# Patient Record
Sex: Female | Born: 1970 | Race: White | Hispanic: No | Marital: Married | Smoking: Never smoker
Health system: Southern US, Community
[De-identification: ages and names within clinical notes are randomized; demographics above are authoritative.]

## PROBLEM LIST (undated history)

## (undated) DIAGNOSIS — E079 Disorder of thyroid, unspecified: Secondary | ICD-10-CM

## (undated) DIAGNOSIS — M8580 Other specified disorders of bone density and structure, unspecified site: Secondary | ICD-10-CM

## (undated) DIAGNOSIS — K259 Gastric ulcer, unspecified as acute or chronic, without hemorrhage or perforation: Secondary | ICD-10-CM

## (undated) DIAGNOSIS — K828 Other specified diseases of gallbladder: Secondary | ICD-10-CM

## (undated) HISTORY — PX: PARTIAL THYMECTOMY: SHX2177

## (undated) HISTORY — DX: Gastric ulcer, unspecified as acute or chronic, without hemorrhage or perforation: K25.9

## (undated) HISTORY — DX: Disorder of thyroid, unspecified: E07.9

## (undated) HISTORY — DX: Other specified disorders of bone density and structure, unspecified site: M85.80

---

## 1994-12-29 HISTORY — PX: THYROIDECTOMY, PARTIAL: SHX18

## 2010-10-11 ENCOUNTER — Emergency Department (HOSPITAL_COMMUNITY): Admission: EM | Admit: 2010-10-11 | Discharge: 2010-10-11 | Payer: Self-pay | Admitting: Family Medicine

## 2011-07-04 ENCOUNTER — Other Ambulatory Visit: Payer: Self-pay | Admitting: Obstetrics and Gynecology

## 2011-07-04 DIAGNOSIS — R928 Other abnormal and inconclusive findings on diagnostic imaging of breast: Secondary | ICD-10-CM

## 2011-07-15 ENCOUNTER — Ambulatory Visit
Admission: RE | Admit: 2011-07-15 | Discharge: 2011-07-15 | Disposition: A | Discharge status: Home / Self Care | Payer: BC Managed Care – PPO | Source: Ambulatory Visit | Attending: Obstetrics and Gynecology | Admitting: Obstetrics and Gynecology

## 2011-07-15 DIAGNOSIS — R928 Other abnormal and inconclusive findings on diagnostic imaging of breast: Secondary | ICD-10-CM

## 2012-03-24 ENCOUNTER — Encounter (INDEPENDENT_AMBULATORY_CARE_PROVIDER_SITE_OTHER): Payer: Self-pay | Admitting: Surgery

## 2012-04-09 ENCOUNTER — Ambulatory Visit (INDEPENDENT_AMBULATORY_CARE_PROVIDER_SITE_OTHER): Payer: BC Managed Care – PPO | Admitting: Surgery

## 2012-04-29 ENCOUNTER — Ambulatory Visit (INDEPENDENT_AMBULATORY_CARE_PROVIDER_SITE_OTHER): Payer: BC Managed Care – PPO | Admitting: Surgery

## 2012-12-29 HISTORY — PX: WISDOM TOOTH EXTRACTION: SHX21

## 2015-01-02 ENCOUNTER — Other Ambulatory Visit: Payer: Self-pay | Admitting: Occupational Medicine

## 2015-01-02 ENCOUNTER — Ambulatory Visit: Payer: Self-pay

## 2015-01-02 DIAGNOSIS — R52 Pain, unspecified: Secondary | ICD-10-CM

## 2015-09-01 ENCOUNTER — Emergency Department (HOSPITAL_COMMUNITY)
Admission: EM | Admit: 2015-09-01 | Discharge: 2015-09-01 | Disposition: A | Discharge status: Home / Self Care | Payer: Commercial Managed Care - HMO | Source: Home / Self Care | Attending: Family Medicine | Admitting: Family Medicine

## 2015-09-01 ENCOUNTER — Encounter (HOSPITAL_COMMUNITY): Payer: Self-pay | Admitting: Family Medicine

## 2015-09-01 DIAGNOSIS — M5442 Lumbago with sciatica, left side: Secondary | ICD-10-CM

## 2015-09-01 MED ORDER — DICLOFENAC SODIUM 75 MG PO TBEC: 75.0000 mg | DELAYED_RELEASE_TABLET | Freq: Two times a day (BID) | ORAL | Status: DC

## 2015-09-01 MED ORDER — METHYLPREDNISOLONE 4 MG PO TBPK: ORAL_TABLET | ORAL | Status: DC

## 2015-09-01 MED ORDER — METHOCARBAMOL 500 MG PO TABS: 500.0000 mg | ORAL_TABLET | Freq: Four times a day (QID) | ORAL | Status: DC | PRN

## 2015-09-01 NOTE — Discharge Instructions (Signed)
You likely have strained part of your lower back muscles causing malalignment of the vertebrae and sciatica. Please start the Medrol Dosepak as prescribed morning. Please also start the sciatica exercises as outlined. In the evening consider taking the muscle relaxer Robaxin, followed by a hot shower and then exercises before going to bed. The Robaxin may make you sleepy but you may also be able to take it during the daytime as tolerated. On Tuesday please call your primary care physician, sports medicine clinic, orthopedic surgeon's office for follow-up in case you do not improve.  Sciatica with Rehab The sciatic nerve runs from the back down the leg and is responsible for sensation and control of the muscles in the back (posterior) side of the thigh, lower leg, and foot. Sciatica is a condition that is characterized by inflammation of this nerve.  SYMPTOMS   Signs of nerve damage, including numbness and/or weakness along the posterior side of the lower extremity.  Pain in the back of the thigh that may also travel down the leg.  Pain that worsens when sitting for long periods of time.  Occasionally, pain in the back or buttock. CAUSES  Inflammation of the sciatic nerve is the cause of sciatica. The inflammation is due to something irritating the nerve. Common sources of irritation include:  Sitting for long periods of time.  Direct trauma to the nerve.  Arthritis of the spine.  Herniated or ruptured disk.  Slipping of the vertebrae (spondylolisthesis).  Pressure from soft tissues, such as muscles or ligament-like tissue (fascia). RISK INCREASES WITH:  Sports that place pressure or stress on the spine (football or weightlifting).  Poor strength and flexibility.  Failure to warm up properly before activity.  Family history of low back pain or disk disorders.  Previous back injury or surgery.  Poor body mechanics, especially when lifting, or poor posture. PREVENTION   Warm  up and stretch properly before activity.  Maintain physical fitness:  Strength, flexibility, and endurance.  Cardiovascular fitness.  Learn and use proper technique, especially with posture and lifting. When possible, have coach correct improper technique.  Avoid activities that place stress on the spine. PROGNOSIS If treated properly, then sciatica usually resolves within 6 weeks. However, occasionally surgery is necessary.  RELATED COMPLICATIONS   Permanent nerve damage, including pain, numbness, tingle, or weakness.  Chronic back pain.  Risks of surgery: infection, bleeding, nerve damage, or damage to surrounding tissues. TREATMENT Treatment initially involves resting from any activities that aggravate your symptoms. The use of ice and medication may help reduce pain and inflammation. The use of strengthening and stretching exercises may help reduce pain with activity. These exercises may be performed at home or with referral to a therapist. A therapist may recommend further treatments, such as transcutaneous electronic nerve stimulation (TENS) or ultrasound. Your caregiver may recommend corticosteroid injections to help reduce inflammation of the sciatic nerve. If symptoms persist despite non-surgical (conservative) treatment, then surgery may be recommended. MEDICATION  If pain medication is necessary, then nonsteroidal anti-inflammatory medications, such as aspirin and ibuprofen, or other minor pain relievers, such as acetaminophen, are often recommended.  Do not take pain medication for 7 days before surgery.  Prescription pain relievers may be given if deemed necessary by your caregiver. Use only as directed and only as much as you need.  Ointments applied to the skin may be helpful.  Corticosteroid injections may be given by your caregiver. These injections should be reserved for the most serious cases, because they  may only be given a certain number of times. HEAT AND  COLD  Cold treatment (icing) relieves pain and reduces inflammation. Cold treatment should be applied for 10 to 15 minutes every 2 to 3 hours for inflammation and pain and immediately after any activity that aggravates your symptoms. Use ice packs or massage the area with a piece of ice (ice massage).  Heat treatment may be used prior to performing the stretching and strengthening activities prescribed by your caregiver, physical therapist, or athletic trainer. Use a heat pack or soak the injury in warm water. SEEK MEDICAL CARE IF:  Treatment seems to offer no benefit, or the condition worsens.  Any medications produce adverse side effects. EXERCISES  RANGE OF MOTION (ROM) AND STRETCHING EXERCISES - Sciatica Most people with sciatic will find that their symptoms worsen with either excessive bending forward (flexion) or arching at the low back (extension). The exercises which will help resolve your symptoms will focus on the opposite motion. Your physician, physical therapist or athletic trainer will help you determine which exercises will be most helpful to resolve your low back pain. Do not complete any exercises without first consulting with your clinician. Discontinue any exercises which worsen your symptoms until you speak to your clinician. If you have pain, numbness or tingling which travels down into your buttocks, leg or foot, the goal of the therapy is for these symptoms to move closer to your back and eventually resolve. Occasionally, these leg symptoms will get better, but your low back pain may worsen; this is typically an indication of progress in your rehabilitation. Be certain to be very alert to any changes in your symptoms and the activities in which you participated in the 24 hours prior to the change. Sharing this information with your clinician will allow him/her to most efficiently treat your condition. These exercises may help you when beginning to rehabilitate your injury. Your  symptoms may resolve with or without further involvement from your physician, physical therapist or athletic trainer. While completing these exercises, remember:   Restoring tissue flexibility helps normal motion to return to the joints. This allows healthier, less painful movement and activity.  An effective stretch should be held for at least 30 seconds.  A stretch should never be painful. You should only feel a gentle lengthening or release in the stretched tissue. FLEXION RANGE OF MOTION AND STRETCHING EXERCISES: STRETCH - Flexion, Single Knee to Chest   Lie on a firm bed or floor with both legs extended in front of you.  Keeping one leg in contact with the floor, bring your opposite knee to your chest. Hold your leg in place by either grabbing behind your thigh or at your knee.  Pull until you feel a gentle stretch in your low back. Hold __________ seconds.  Slowly release your grasp and repeat the exercise with the opposite side. Repeat __________ times. Complete this exercise __________ times per day.  STRETCH - Flexion, Double Knee to Chest  Lie on a firm bed or floor with both legs extended in front of you.  Keeping one leg in contact with the floor, bring your opposite knee to your chest.  Tense your stomach muscles to support your back and then lift your other knee to your chest. Hold your legs in place by either grabbing behind your thighs or at your knees.  Pull both knees toward your chest until you feel a gentle stretch in your low back. Hold __________ seconds.  Tense your stomach muscles  and slowly return one leg at a time to the floor. Repeat __________ times. Complete this exercise __________ times per day.  STRETCH - Low Trunk Rotation   Lie on a firm bed or floor. Keeping your legs in front of you, bend your knees so they are both pointed toward the ceiling and your feet are flat on the floor.  Extend your arms out to the side. This will stabilize your upper  body by keeping your shoulders in contact with the floor.  Gently and slowly drop both knees together to one side until you feel a gentle stretch in your low back. Hold for __________ seconds.  Tense your stomach muscles to support your low back as you bring your knees back to the starting position. Repeat the exercise to the other side. Repeat __________ times. Complete this exercise __________ times per day  EXTENSION RANGE OF MOTION AND FLEXIBILITY EXERCISES: STRETCH - Extension, Prone on Elbows  Lie on your stomach on the floor, a bed will be too soft. Place your palms about shoulder width apart and at the height of your head.  Place your elbows under your shoulders. If this is too painful, stack pillows under your chest.  Allow your body to relax so that your hips drop lower and make contact more completely with the floor.  Hold this position for __________ seconds.  Slowly return to lying flat on the floor. Repeat __________ times. Complete this exercise __________ times per day.  RANGE OF MOTION - Extension, Prone Press Ups  Lie on your stomach on the floor, a bed will be too soft. Place your palms about shoulder width apart and at the height of your head.  Keeping your back as relaxed as possible, slowly straighten your elbows while keeping your hips on the floor. You may adjust the placement of your hands to maximize your comfort. As you gain motion, your hands will come more underneath your shoulders.  Hold this position __________ seconds.  Slowly return to lying flat on the floor. Repeat __________ times. Complete this exercise __________ times per day.  STRENGTHENING EXERCISES - Sciatica  These exercises may help you when beginning to rehabilitate your injury. These exercises should be done near your "sweet spot." This is the neutral, low-back arch, somewhere between fully rounded and fully arched, that is your least painful position. When performed in this safe range of  motion, these exercises can be used for people who have either a flexion or extension based injury. These exercises may resolve your symptoms with or without further involvement from your physician, physical therapist or athletic trainer. While completing these exercises, remember:   Muscles can gain both the endurance and the strength needed for everyday activities through controlled exercises.  Complete these exercises as instructed by your physician, physical therapist or athletic trainer. Progress with the resistance and repetition exercises only as your caregiver advises.  You may experience muscle soreness or fatigue, but the pain or discomfort you are trying to eliminate should never worsen during these exercises. If this pain does worsen, stop and make certain you are following the directions exactly. If the pain is still present after adjustments, discontinue the exercise until you can discuss the trouble with your clinician. STRENGTHENING - Deep Abdominals, Pelvic Tilt   Lie on a firm bed or floor. Keeping your legs in front of you, bend your knees so they are both pointed toward the ceiling and your feet are flat on the floor.  Tense your lower  abdominal muscles to press your low back into the floor. This motion will rotate your pelvis so that your tail bone is scooping upwards rather than pointing at your feet or into the floor.  With a gentle tension and even breathing, hold this position for __________ seconds. Repeat __________ times. Complete this exercise __________ times per day.  STRENGTHENING - Abdominals, Crunches   Lie on a firm bed or floor. Keeping your legs in front of you, bend your knees so they are both pointed toward the ceiling and your feet are flat on the floor. Cross your arms over your chest.  Slightly tip your chin down without bending your neck.  Tense your abdominals and slowly lift your trunk high enough to just clear your shoulder blades. Lifting higher can  put excessive stress on the low back and does not further strengthen your abdominal muscles.  Control your return to the starting position. Repeat __________ times. Complete this exercise __________ times per day.  STRENGTHENING - Quadruped, Opposite UE/LE Lift  Assume a hands and knees position on a firm surface. Keep your hands under your shoulders and your knees under your hips. You may place padding under your knees for comfort.  Find your neutral spine and gently tense your abdominal muscles so that you can maintain this position. Your shoulders and hips should form a rectangle that is parallel with the floor and is not twisted.  Keeping your trunk steady, lift your right hand no higher than your shoulder and then your left leg no higher than your hip. Make sure you are not holding your breath. Hold this position __________ seconds.  Continuing to keep your abdominal muscles tense and your back steady, slowly return to your starting position. Repeat with the opposite arm and leg. Repeat __________ times. Complete this exercise __________ times per day.  STRENGTHENING - Abdominals and Quadriceps, Straight Leg Raise   Lie on a firm bed or floor with both legs extended in front of you.  Keeping one leg in contact with the floor, bend the other knee so that your foot can rest flat on the floor.  Find your neutral spine, and tense your abdominal muscles to maintain your spinal position throughout the exercise.  Slowly lift your straight leg off the floor about 6 inches for a count of 15, making sure to not hold your breath.  Still keeping your neutral spine, slowly lower your leg all the way to the floor. Repeat this exercise with each leg __________ times. Complete this exercise __________ times per day. POSTURE AND BODY MECHANICS CONSIDERATIONS - Sciatica Keeping correct posture when sitting, standing or completing your activities will reduce the stress put on different body tissues,  allowing injured tissues a chance to heal and limiting painful experiences. The following are general guidelines for improved posture. Your physician or physical therapist will provide you with any instructions specific to your needs. While reading these guidelines, remember:  The exercises prescribed by your provider will help you have the flexibility and strength to maintain correct postures.  The correct posture provides the optimal environment for your joints to work. All of your joints have less wear and tear when properly supported by a spine with good posture. This means you will experience a healthier, less painful body.  Correct posture must be practiced with all of your activities, especially prolonged sitting and standing. Correct posture is as important when doing repetitive low-stress activities (typing) as it is when doing a single heavy-load activity (lifting).  RESTING POSITIONS Consider which positions are most painful for you when choosing a resting position. If you have pain with flexion-based activities (sitting, bending, stooping, squatting), choose a position that allows you to rest in a less flexed posture. You would want to avoid curling into a fetal position on your side. If your pain worsens with extension-based activities (prolonged standing, working overhead), avoid resting in an extended position such as sleeping on your stomach. Most people will find more comfort when they rest with their spine in a more neutral position, neither too rounded nor too arched. Lying on a non-sagging bed on your side with a pillow between your knees, or on your back with a pillow under your knees will often provide some relief. Keep in mind, being in any one position for a prolonged period of time, no matter how correct your posture, can still lead to stiffness. PROPER SITTING POSTURE In order to minimize stress and discomfort on your spine, you must sit with correct posture Sitting with good  posture should be effortless for a healthy body. Returning to good posture is a gradual process. Many people can work toward this most comfortably by using various supports until they have the flexibility and strength to maintain this posture on their own. When sitting with proper posture, your ears will fall over your shoulders and your shoulders will fall over your hips. You should use the back of the chair to support your upper back. Your low back will be in a neutral position, just slightly arched. You may place a small pillow or folded towel at the base of your low back for support.  When working at a desk, create an environment that supports good, upright posture. Without extra support, muscles fatigue and lead to excessive strain on joints and other tissues. Keep these recommendations in mind: CHAIR:   A chair should be able to slide under your desk when your back makes contact with the back of the chair. This allows you to work closely.  The chair's height should allow your eyes to be level with the upper part of your monitor and your hands to be slightly lower than your elbows. BODY POSITION  Your feet should make contact with the floor. If this is not possible, use a foot rest.  Keep your ears over your shoulders. This will reduce stress on your neck and low back. INCORRECT SITTING POSTURES   If you are feeling tired and unable to assume a healthy sitting posture, do not slouch or slump. This puts excessive strain on your back tissues, causing more damage and pain. Healthier options include:  Using more support, like a lumbar pillow.  Switching tasks to something that requires you to be upright or walking.  Talking a brief walk.  Lying down to rest in a neutral-spine position. PROLONGED STANDING WHILE SLIGHTLY LEANING FORWARD  When completing a task that requires you to lean forward while standing in one place for a long time, place either foot up on a stationary 2-4 inch high  object to help maintain the best posture. When both feet are on the ground, the low back tends to lose its slight inward curve. If this curve flattens (or becomes too large), then the back and your other joints will experience too much stress, fatigue more quickly and can cause pain.  CORRECT STANDING POSTURES Proper standing posture should be assumed with all daily activities, even if they only take a few moments, like when brushing your teeth. As in  sitting, your ears should fall over your shoulders and your shoulders should fall over your hips. You should keep a slight tension in your abdominal muscles to brace your spine. Your tailbone should point down to the ground, not behind your body, resulting in an over-extended swayback posture.  INCORRECT STANDING POSTURES  Common incorrect standing postures include a forward head, locked knees and/or an excessive swayback. WALKING Walk with an upright posture. Your ears, shoulders and hips should all line-up. PROLONGED ACTIVITY IN A FLEXED POSITION When completing a task that requires you to bend forward at your waist or lean over a low surface, try to find a way to stabilize 3 of 4 of your limbs. You can place a hand or elbow on your thigh or rest a knee on the surface you are reaching across. This will provide you more stability so that your muscles do not fatigue as quickly. By keeping your knees relaxed, or slightly bent, you will also reduce stress across your low back. CORRECT LIFTING TECHNIQUES DO :   Assume a wide stance. This will provide you more stability and the opportunity to get as close as possible to the object which you are lifting.  Tense your abdominals to brace your spine; then bend at the knees and hips. Keeping your back locked in a neutral-spine position, lift using your leg muscles. Lift with your legs, keeping your back straight.  Test the weight of unknown objects before attempting to lift them.  Try to keep your elbows  locked down at your sides in order get the best strength from your shoulders when carrying an object.  Always ask for help when lifting heavy or awkward objects. INCORRECT LIFTING TECHNIQUES DO NOT:   Lock your knees when lifting, even if it is a small object.  Bend and twist. Pivot at your feet or move your feet when needing to change directions.  Assume that you cannot safely pick up a paperclip without proper posture. Document Released: 12/15/2005 Document Revised: 05/01/2014 Document Reviewed: 03/29/2009 [facility] Patient Information 2015 [place], [place]. This information is not intended to replace advice given to you by your health care provider. Make sure you discuss any questions you have with your health care provider.

## 2015-09-01 NOTE — ED Provider Notes (Signed)
CSN: [id number]     Arrival date & time 09/01/15  1705 History   First MD Initiated Contact with Patient 09/01/15 1729     Chief Complaint  Patient presents with  . Back Pain   (Consider location/radiation/quality/duration/timing/severity/associated sxs/prior Treatment) HPI  L lower back. Radiation down L leg. Recent exertion w/ lifting items at work. Current episode started this morning. Previous episode was one week ago. Episodes are coming on w/ greater frequency. Aleve w/o benefit this am. Pain is constant since this am. Not better or worse since onset.   Denies joint effusions, trauma, fevers, rash, CP, SOB, saddle anesthesia or loss of bowel or bladder function or weakness.  Has had this in the past for years   Past Medical History  Diagnosis Date  . Thyroid disease   . Migraine    Past Surgical History  Procedure Laterality Date  . Partial thymectomy     Family History  Problem Relation Age of Onset  . Thyroid disease Mother   . Cancer Mother   . Cancer Other    Social History  Substance Use Topics  . Smoking status: Never Smoker   . Smokeless tobacco: None  . Alcohol Use: Yes   OB History    No data available     Review of Systems Per HPI with all other pertinent systems negative.   Allergies  Review of patient's allergies indicates no known allergies.  Home Medications   Prior to Admission medications   Medication Sig Start Date End Date Taking? Authorizing Provider  beta carotene w/minerals (OCUVITE) tablet Take 1 tablet by mouth daily.    Historical Provider, MD  methocarbamol (ROBAXIN) 500 MG tablet Take 1-2 tablets (500-1,000 mg total) by mouth every 6 (six) hours as needed for muscle spasms. 09/01/15   Waldemar Dickens, MD  methylPREDNISolone (MEDROL DOSEPAK) 4 MG TBPK tablet Take as prescribed 09/01/15   Waldemar Dickens, MD   Meds Ordered and Administered this Visit  Medications - No data to display  BP 99/68 mmHg  Pulse 61  Temp(Src) 97.9 F  (36.6 C) (Oral)  Resp 18  SpO2 99% No data found.   Physical Exam Physical Exam  Constitutional: oriented to person, place, and time. appears well-developed and well-nourished. No distress.  HENT:  Head: Normocephalic and atraumatic.  Eyes: EOMI. PERRL.  Neck: Normal range of motion.  Abdominal: Soft. Bowel sounds are normal. NonTTP, no distension.  Musculoskeletal: Left lower paraspinal muscle tightness and tenderness to palpation..  Neurological: alert and oriented to person, place, and time.  Skin: Skin is warm. No rash noted. non diaphoretic.  Psychiatric: normal mood and affect. behavior is normal. Judgment and thought content normal.   ED Course  Procedures (including critical care time)  Labs Review Labs Reviewed - No data to display  Imaging Review No results found.   Visual Acuity Review  Right Eye Distance:   Left Eye Distance:   Bilateral Distance:    Right Eye Near:   Left Eye Near:    Bilateral Near:         MDM   1. Low back pain with radiation, left    Start Medrol Dosepak and Robaxin and exercises. Start Voltaren after stopping Medrol dose pack. Patient may need more formal follow-up in sports medicine or primary care physician's office for PT or other workup.    Waldemar Dickens, MD 09/01/15 [phone]

## 2015-09-01 NOTE — ED Notes (Signed)
Pt here with low back pain radiating down left leg

## 2015-09-10 ENCOUNTER — Ambulatory Visit
Admission: RE | Admit: 2015-09-10 | Discharge: 2015-09-10 | Disposition: A | Discharge status: Home / Self Care | Payer: Commercial Managed Care - HMO | Source: Ambulatory Visit | Attending: Sports Medicine | Admitting: Sports Medicine

## 2015-09-10 ENCOUNTER — Encounter: Payer: Self-pay | Admitting: Sports Medicine

## 2015-09-10 ENCOUNTER — Ambulatory Visit (INDEPENDENT_AMBULATORY_CARE_PROVIDER_SITE_OTHER): Payer: Commercial Managed Care - HMO | Admitting: Sports Medicine

## 2015-09-10 VITALS — BP 117/72 | Ht 61.0 in | Wt 140.0 lb

## 2015-09-10 DIAGNOSIS — M545 Low back pain, unspecified: Secondary | ICD-10-CM | POA: Insufficient documentation

## 2015-09-10 NOTE — Assessment & Plan Note (Signed)
Patient's symptoms most consistent with left sided SI joint inflammation. DDx includes possible disc herniation w/ nerve root compression at the L5 level, as we cannot yet rule this out w/ some occasional radiation of pain to the left foot and some slight weakness appreciated of the extensor hallicus longus.  - 2 view lumbar XR ordered - Formal physical therapy referral made. - patient is to continue taking diclofenac as prescribed - Ice/heat as needed  Next:  - Consider IM steroid/toradol injection.  - Consider MRI if symptoms persist/worsen.

## 2015-09-10 NOTE — Progress Notes (Addendum)
   HPI  CC: Left low back pain Gutierrez here for left sided low back pain. Pain first presented ~3wks ago. She states that prior to this onset she had been lifting and moving books at work (she works as a Licensed conveyancer). She denies any specific moment of injury or awkward movement. The following day was the first day of pain. Pain has steadily worsened since that time. Pain is located to the left lumbar perispinals, left SI, and left glut. Pain is sharp and sometimes electric in nature. She reports a couple of instances where the pain traveled beyond the knee in into the posterior foot/ankle. She denies weakness but reports some occasional numbness in her left thigh. Denies saddle anesthesia, bladder/bowel incontinence, weakness, falls.   Review of Systems   See HPI for ROS. All other systems reviewed and are negative.  Objective: BP 117/72 mmHg  Ht 5\' 1"  (1.549 m)  Wt 140 lb (63.504 kg)  BMI 26.47 kg/m2 Gen: NAD, alert, cooperative, and pleasant. Lower extremities: FROM, some discomfort in Lt leg w/ full flexion. Strength >> IR: 5/5, ER: 5/5, Flexion: 5/5, Extension: 5/5, Abduction: 5/5, Adduction: 5/5, Plantar flexion 5/5, Dorsiflexion 5/5, Extensor Hallicus Longus +1/6 (Left) 5/5 (right). Pelvic alignment unremarkable to inspection and palpation. Standing hip rotation and gait without trendelenburg. Greater trochanter without tenderness to palpation. Left SI joint tenderness, and positive FABER test. Negative straight leg raise for reproducible pain beyond knee. Neuro: Alert and oriented, Speech clear, No gross deficits. Sensation intact and symmetric bilaterally. DTRs +3 patellar (bilat), +2 Achilles (bilat).  Assessment and plan:  Low back pain Gutierrez's symptoms most consistent with left sided SI joint inflammation. DDx includes possible disc herniation w/ nerve root compression at the L5 level, as we cannot yet rule this out w/ some occasional radiation of pain to the left foot and some slight  weakness appreciated of the extensor hallicus longus.  - 2 view lumbar XR ordered - Formal physical therapy referral made. - Gutierrez is to continue taking diclofenac as prescribed - Ice/heat as needed  Next:  - Consider IM steroid/toradol injection.  - Consider MRI if symptoms persist/worsen.   [name], MD,MS,  PGY2 09/10/2015 11:56 AM   Gutierrez seen and evaluated with the above-named resident. I agree with the plan of care. Gutierrez's symptoms are likely originating from her left SI joint but she may also be getting an element of lumbar radiculopathy. We discussed the possibility of I am cortisone and Toradol injections but we will wait on that for now. She will start physical therapy with [name] and will follow-up with me in 3 weeks. She may continue with her Voltaren as needed. I've asked that she avoid any sort of activity that causes increasing pain. Call with questions or concerns prior to her follow-up visit.  Addendum: Lumbar x-rays are reviewed. Very slight scoliotic curve but otherwise unremarkable. No significant disc space narrowing. SI joints are unremarkable.

## 2015-10-02 ENCOUNTER — Encounter: Payer: Self-pay | Admitting: Sports Medicine

## 2015-10-02 ENCOUNTER — Ambulatory Visit (INDEPENDENT_AMBULATORY_CARE_PROVIDER_SITE_OTHER): Payer: Commercial Managed Care - HMO | Admitting: Sports Medicine

## 2015-10-02 VITALS — BP 106/47 | HR 74 | Ht 61.0 in | Wt 140.0 lb

## 2015-10-02 DIAGNOSIS — M545 Low back pain: Secondary | ICD-10-CM

## 2015-10-02 NOTE — Progress Notes (Signed)
   Subjective:    Patient ID: Crystal Gutierrez, female    DOB: 1971-02-09, 44 y.o.   MRN: [id number]  HPI CC: Low back pain  Patient comes in today for follow-up on low back pain and left leg radiculopathy. Overall symptoms are improving. Numbness and tingling that she was experiencing down the left leg have resolved. No weakness. Only residual symptom is some tightness in her left lower back. She has been working with Vivi Ferns in physical therapy and feels like that has been helpful. Today is her last physical therapy visit. She has weaned off of the oral anti-inflammatory's. X-rays of her lumbar spine showed a very slight scoliotic curve but otherwise was unremarkable.    Review of Systems    as above Objective:   Physical Exam Well-developed, well-nourished. No acute distress. Awake alert and oriented 3. Vital signs reviewed.  Lumbar spine: Good lumbar range of motion. No spasm. Negative straight leg raise. Strength is 5/5 both lower extremities. Sensations intact light touch grossly. Negative Faber's. Walking without a limp.       Assessment & Plan:  Improving low back pain with left leg radiculopathy likely secondary to bulging lumbar disc  Patient symptoms are improving with physical therapy. I do not think we need to pursue further diagnostic imaging at this time. However, if her symptoms return I would reconsider this. She understands the importance of continuing with a home exercise program and avoiding activities that put extra stress on her back. Patient will notify me if symptoms persist or worsen. Otherwise, follow-up as needed.

## 2016-03-01 ENCOUNTER — Emergency Department (HOSPITAL_COMMUNITY)
Admission: EM | Admit: 2016-03-01 | Discharge: 2016-03-01 | Disposition: A | Discharge status: Home / Self Care | Payer: 59 | Source: Home / Self Care | Attending: Emergency Medicine | Admitting: Emergency Medicine

## 2016-03-01 ENCOUNTER — Encounter (HOSPITAL_COMMUNITY): Payer: Self-pay | Admitting: Emergency Medicine

## 2016-03-01 DIAGNOSIS — J111 Influenza due to unidentified influenza virus with other respiratory manifestations: Secondary | ICD-10-CM | POA: Diagnosis not present

## 2016-03-01 MED ORDER — AZITHROMYCIN 250 MG PO TABS: ORAL_TABLET | ORAL | Status: DC

## 2016-03-01 NOTE — ED Notes (Signed)
The patient presented to the [facility] with a complaint of a cough and fever x 4 days.

## 2016-03-01 NOTE — Discharge Instructions (Signed)
Cough, Adult Coughing is a reflex that clears your throat and your airways. Coughing helps to heal and protect your lungs. It is normal to cough occasionally, but a cough that happens with other symptoms or lasts a long time may be a sign of a condition that needs treatment. A cough may last only 2-3 weeks (acute), or it may last longer than 8 weeks (chronic). CAUSES Coughing is commonly caused by:  Breathing in substances that irritate your lungs.  A viral or bacterial respiratory infection.  Allergies.  Asthma.  Postnasal drip.  Smoking.  Acid backing up from the stomach into the esophagus (gastroesophageal reflux).  Certain medicines.  Chronic lung problems, including COPD (or rarely, lung cancer).  Other medical conditions such as heart failure. HOME CARE INSTRUCTIONS  Pay attention to any changes in your symptoms. Take these actions to help with your discomfort:  Take medicines only as told by your health care provider.  If you were prescribed an antibiotic medicine, take it as told by your health care provider. Do not stop taking the antibiotic even if you start to feel better.  Talk with your health care provider before you take a cough suppressant medicine.  Drink enough fluid to keep your urine clear or pale yellow.  If the air is dry, use a cold steam vaporizer or humidifier in your bedroom or your home to help loosen secretions.  Avoid anything that causes you to cough at work or at home.  If your cough is worse at night, try sleeping in a semi-upright position.  Avoid cigarette smoke. If you smoke, quit smoking. If you need help quitting, ask your health care provider.  Avoid caffeine.  Avoid alcohol.  Rest as needed. SEEK MEDICAL CARE IF:   You have new symptoms.  You cough up pus.  Your cough does not get better after 2-3 weeks, or your cough gets worse.  You cannot control your cough with suppressant medicines and you are losing sleep.  You  develop pain that is getting worse or pain that is not controlled with pain medicines.  You have a fever.  You have unexplained weight loss.  You have night sweats. SEEK IMMEDIATE MEDICAL CARE IF:  You cough up blood.  You have difficulty breathing.  Your heartbeat is very fast.   This information is not intended to replace advice given to you by your health care provider. Make sure you discuss any questions you have with your health care provider.   Document Released: 06/13/2011 Document Revised: 09/05/2015 Document Reviewed: 02/21/2015 Elsevier Interactive Patient Education 2016 Elsevier Inc.  Influenza, Adult Influenza ("the flu") is a viral infection of the respiratory tract. It occurs more often in winter months because people spend more time in close contact with one another. Influenza can make you feel very sick. Influenza easily spreads from person to person (contagious). CAUSES  Influenza is caused by a virus that infects the respiratory tract. You can catch the virus by breathing in droplets from an infected person's cough or sneeze. You can also catch the virus by touching something that was recently contaminated with the virus and then touching your mouth, nose, or eyes. RISKS AND COMPLICATIONS You may be at risk for a more severe case of influenza if you smoke cigarettes, have diabetes, have chronic heart disease (such as heart failure) or lung disease (such as asthma), or if you have a weakened immune system. Elderly people and pregnant women are also at risk for more serious infections.  The most common problem of influenza is a lung infection (pneumonia). Sometimes, this problem can require emergency medical care and may be life threatening. SIGNS AND SYMPTOMS  Symptoms typically last 4 to 10 days and may include:  Fever.  Chills.  Headache, body aches, and muscle aches.  Sore throat.  Chest discomfort and cough.  Poor appetite.  Weakness or feeling  tired.  Dizziness.  Nausea or vomiting. DIAGNOSIS  Diagnosis of influenza is often made based on your history and a physical exam. A nose or throat swab test can be done to confirm the diagnosis. TREATMENT  In mild cases, influenza goes away on its own. Treatment is directed at relieving symptoms. For more severe cases, your health care provider may prescribe antiviral medicines to shorten the sickness. Antibiotic medicines are not effective because the infection is caused by a virus, not by bacteria. HOME CARE INSTRUCTIONS  Take medicines only as directed by your health care provider.  Use a cool mist humidifier to make breathing easier.  Get plenty of rest until your temperature returns to normal. This usually takes 3 to 4 days.  Drink enough fluid to keep your urine clear or pale yellow.  Cover yourmouth and nosewhen coughing or sneezing,and wash your handswellto prevent thevirusfrom spreading.  Stay homefromwork orschool untilthe fever is gonefor at least 16full day. PREVENTION  An annual influenza vaccination (flu shot) is the best way to avoid getting influenza. An annual flu shot is now routinely recommended for all adults in the [place] IF:  You experiencechest pain, yourcough worsens,or you producemore mucus.  Youhave nausea,vomiting, ordiarrhea.  Your fever returns or gets worse. SEEK IMMEDIATE MEDICAL CARE IF:  You havetrouble breathing, you become short of breath,or your skin ornails becomebluish.  You have severe painor stiffnessin the neck.  You develop a sudden headache, or pain in the face or ear.  You have nausea or vomiting that you cannot control. MAKE SURE YOU:   Understand these instructions.  Will watch your condition.  Will get help right away if you are not doing well or get worse.   This information is not intended to replace advice given to you by your health care provider. Make sure you discuss any  questions you have with your health care provider.   Document Released: 12/12/2000 Document Revised: 01/05/2015 Document Reviewed: 03/15/2012 Elsevier Interactive Patient Education 2016 Elsevier Inc. Acute Bronchitis Bronchitis is when the airways that extend from the windpipe into the lungs get red, puffy, and painful (inflamed). Bronchitis often causes thick spit (mucus) to develop. This leads to a cough. A cough is the most common symptom of bronchitis. In acute bronchitis, the condition usually begins suddenly and goes away over time (usually in 2 weeks). Smoking, allergies, and asthma can make bronchitis worse. Repeated episodes of bronchitis may cause more lung problems. HOME CARE  Rest.  Drink enough fluids to keep your pee (urine) clear or pale yellow (unless you need to limit fluids as told by your doctor).  Only take over-the-counter or prescription medicines as told by your doctor.  Avoid smoking and secondhand smoke. These can make bronchitis worse. If you are a smoker, think about using nicotine gum or skin patches. Quitting smoking will help your lungs heal faster.  Reduce the chance of getting bronchitis again by:  Washing your hands often.  Avoiding people with cold symptoms.  Trying not to touch your hands to your mouth, nose, or eyes.  Follow up with your doctor as  told. GET HELP IF: Your symptoms do not improve after 1 week of treatment. Symptoms include:  Cough.  Fever.  Coughing up thick spit.  Body aches.  Chest congestion.  Chills.  Shortness of breath.  Sore throat. GET HELP RIGHT AWAY IF:   You have an increased fever.  You have chills.  You have severe shortness of breath.  You have bloody thick spit (sputum).  You throw up (vomit) often.  You lose too much body fluid (dehydration).  You have a severe headache.  You faint. MAKE SURE YOU:   Understand these instructions.  Will watch your condition.  Will get help right away  if you are not doing well or get worse.   This information is not intended to replace advice given to you by your health care provider. Make sure you discuss any questions you have with your health care provider.   Document Released: 06/02/2008 Document Revised: 08/17/2013 Document Reviewed: 06/07/2013 Elsevier Interactive Patient Education [employer].

## 2016-03-02 NOTE — ED Provider Notes (Signed)
CSN: [id number]     Arrival date & time 03/01/16  1354 History   First MD Initiated Contact with Patient 03/01/16 1508     Chief Complaint  Patient presents with  . Cough  . Fever   (Consider location/radiation/quality/duration/timing/severity/associated sxs/prior Treatment) HPI Pt presents with sore throat, body aches, fever, chills for 3 days Home treatment has been OTC meds without much relief of symptoms Fever is improved for short periods of time with OTC antipyretics. Pain score is 4 mostly from coughing and body aches Taking fluids, no appetite No flu shot Has been exposed to others with similar symptoms.  Denies: CP, SOB, vomiting or diarrhea.  Past Medical History  Diagnosis Date  . Thyroid disease   . Migraine    Past Surgical History  Procedure Laterality Date  . Partial thymectomy     Family History  Problem Relation Age of Onset  . Thyroid disease Mother   . Cancer Mother   . Cancer Other    Social History  Substance Use Topics  . Smoking status: Never Smoker   . Smokeless tobacco: None  . Alcohol Use: Yes   OB History    No data available     Review of Systems Cough, fever, body aches Allergies  Review of patient's allergies indicates no known allergies.  Home Medications   Prior to Admission medications   Medication Sig Start Date End Date Taking? Authorizing Provider  azithromycin (ZITHROMAX Z-PAK) 250 MG tablet 2 tablets today then 1 tablet daily days 2-5. 03/01/16   Konrad Felix, PA  beta carotene w/minerals (OCUVITE) tablet Take 1 tablet by mouth daily.    Historical Provider, MD  diclofenac (VOLTAREN) 75 MG EC tablet Take 1 tablet (75 mg total) by mouth 2 (two) times daily. Patient not taking: Reported on 10/02/2015 09/01/15   Waldemar Dickens, MD  fluconazole (DIFLUCAN) 150 MG tablet Take 150 mg by mouth every other day. 06/26/15   Historical Provider, MD  methocarbamol (ROBAXIN) 500 MG tablet Take 1-2 tablets (500-1,000 mg total) by mouth  every 6 (six) hours as needed for muscle spasms. Patient not taking: Reported on 10/02/2015 09/01/15   Waldemar Dickens, MD  methylPREDNISolone (MEDROL DOSEPAK) 4 MG TBPK tablet Take as prescribed Patient not taking: Reported on 10/02/2015 09/01/15   Waldemar Dickens, MD   Meds Ordered and Administered this Visit  Medications - No data to display  BP 128/75 mmHg  Pulse 116  Temp(Src) 98 F (36.7 C) (Oral)  SpO2 97%  LMP 02/11/2016 (Exact Date) No data found.   Physical Exam NURSES NOTES AND VITAL SIGNS REVIEWED. CONSTITUTIONAL: Well developed, well nourished, no acute distress HEENT: normocephalic, atraumatic, right and left TM's are normal EYES: Conjunctiva normal NECK:normal ROM, supple, no adenopathy PULMONARY:No respiratory distress, normal effort, Lungs: CTAb/l, no wheezes, or increased work of breathing cough sounds like a bit of trachitis  CARDIOVASCULAR: RRR, no murmur ABDOMEN: soft, ND, NT, +'ve BS MUSCULOSKELETAL: Normal ROM of all extremities,  SKIN: warm and dry without rash PSYCHIATRIC: Mood and affect, behavior are normal  ED Course  Procedures (including critical care time)  Labs Review Labs Reviewed - No data to display  Imaging Review No results found.   Visual Acuity Review  Right Eye Distance:   Left Eye Distance:   Bilateral Distance:    Right Eye Near:   Left Eye Near:    Bilateral Near:         MDM   1. Bronchitis with  influenza    Patient is reassured that there are no issues that require transfer to higher level of care at this time.  Patient is advised to continue home symptomatic treatment. Prescription is sent to  pharmacy patient has indicated.  Patient is advised that if there are new or worsening symptoms or attend the emergency department, or contact primary care provider. Instructions of care provided discharged home in stable condition. Return to work/school note provided.  THIS NOTE WAS GENERATED USING A VOICE RECOGNITION  SOFTWARE PROGRAM. ALL REASONABLE EFFORTS  WERE MADE TO PROOFREAD THIS DOCUMENT FOR ACCURACY.     Konrad Felix, [place] 03/02/16 [phone]

## 2017-03-04 DIAGNOSIS — N912 Amenorrhea, unspecified: Secondary | ICD-10-CM | POA: Diagnosis not present

## 2017-03-04 DIAGNOSIS — H6691 Otitis media, unspecified, right ear: Secondary | ICD-10-CM | POA: Diagnosis not present

## 2017-03-04 DIAGNOSIS — R1084 Generalized abdominal pain: Secondary | ICD-10-CM | POA: Diagnosis not present

## 2017-03-11 DIAGNOSIS — K210 Gastro-esophageal reflux disease with esophagitis: Secondary | ICD-10-CM | POA: Diagnosis not present

## 2017-03-11 DIAGNOSIS — R1084 Generalized abdominal pain: Secondary | ICD-10-CM | POA: Diagnosis not present

## 2017-03-11 DIAGNOSIS — R1011 Right upper quadrant pain: Secondary | ICD-10-CM | POA: Diagnosis not present

## 2017-03-11 DIAGNOSIS — J069 Acute upper respiratory infection, unspecified: Secondary | ICD-10-CM | POA: Diagnosis not present

## 2017-03-22 DIAGNOSIS — K210 Gastro-esophageal reflux disease with esophagitis: Secondary | ICD-10-CM | POA: Diagnosis not present

## 2017-03-22 DIAGNOSIS — R1084 Generalized abdominal pain: Secondary | ICD-10-CM | POA: Diagnosis not present

## 2017-03-22 DIAGNOSIS — J22 Unspecified acute lower respiratory infection: Secondary | ICD-10-CM | POA: Diagnosis not present

## 2017-06-05 DIAGNOSIS — Z01419 Encounter for gynecological examination (general) (routine) without abnormal findings: Secondary | ICD-10-CM | POA: Diagnosis not present

## 2017-08-17 DIAGNOSIS — R109 Unspecified abdominal pain: Secondary | ICD-10-CM | POA: Diagnosis not present

## 2017-08-20 ENCOUNTER — Other Ambulatory Visit (HOSPITAL_COMMUNITY): Payer: Self-pay | Admitting: Family Medicine

## 2017-08-20 DIAGNOSIS — R109 Unspecified abdominal pain: Secondary | ICD-10-CM

## 2017-09-07 ENCOUNTER — Ambulatory Visit (HOSPITAL_COMMUNITY)
Admission: RE | Admit: 2017-09-07 | Discharge: 2017-09-07 | Disposition: A | Discharge status: Home / Self Care | Payer: 59 | Source: Ambulatory Visit | Attending: Family Medicine | Admitting: Family Medicine

## 2017-09-07 ENCOUNTER — Encounter (HOSPITAL_COMMUNITY): Payer: Self-pay | Admitting: Radiology

## 2017-09-07 DIAGNOSIS — R1011 Right upper quadrant pain: Secondary | ICD-10-CM | POA: Diagnosis not present

## 2017-09-07 DIAGNOSIS — R109 Unspecified abdominal pain: Secondary | ICD-10-CM | POA: Insufficient documentation

## 2017-09-07 DIAGNOSIS — K828 Other specified diseases of gallbladder: Secondary | ICD-10-CM | POA: Insufficient documentation

## 2017-09-07 MED ORDER — TECHNETIUM TC 99M MEBROFENIN IV KIT
5.0000 | PACK | Freq: Once | INTRAVENOUS | Status: AC | PRN
  Administered 2017-09-07: 5 via INTRAVENOUS

## 2017-09-08 ENCOUNTER — Ambulatory Visit (HOSPITAL_COMMUNITY): Payer: 59

## 2017-09-28 DIAGNOSIS — K828 Other specified diseases of gallbladder: Secondary | ICD-10-CM | POA: Diagnosis not present

## 2017-09-28 DIAGNOSIS — Z9889 Other specified postprocedural states: Secondary | ICD-10-CM | POA: Diagnosis not present

## 2017-09-29 ENCOUNTER — Other Ambulatory Visit: Payer: Self-pay | Admitting: General Surgery

## 2017-10-17 NOTE — H&P (Addendum)
[place]  Location: [facility] Patient #: [id number] DOB: [birth year] Married / Language: English / Race: White Female       History of Present Illness  The patient is a 46 year old female who presents with a complaint of biliary dyskinesia. This is a very pleasant 46 year old female, referred by Dr. Rachell Cipro for right upper quadrant pain and abnormal hepatobiliary scan.      Since November 2017 she's had intermittent attacks of right upper quadrant pain. This is clearly related to meals and she is now avoiding fried foods. The pain will sometimes last for a few days. She gets nauseated but does not vomit. No alteration of bowel habits. [facility]. Reportedly lab work was normal. I do have a gallbladder ultrasound which shows normal gallbladder. CBD 3 mm. Normal liver. Normal aorta. Normal kidneys. Normal pancreas and spleen. She has tried proton pump inhibitors intermittently but that doesn't seem to alter her symptoms. She saw Dr. Ernie Hew in September 2018. Reportedly lab work is normal. Hepatobiliary scan with ejection fraction is abnormal. Ejection fraction was 26% normal being greater than 33%. She also states that she had pain when she drank the ensure meal. She is asymptomatic today.     Comorbidities are minimal. She is healthy hemithyroidectomy for benign nodule. Family history is negative for biliary tract disease. Mother had thyroid cancer and a pulmonary embolus and living. Father living with skin cancer. Social history reveals she is married with 2 children. 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 Denies tobacco. Drinks alcohol occasionally.      We had a long talk. I reviewed the patient information booklet. I told her that her symptoms coupled with her abnormal HIDA scan and absence of other disease process makes it fairly likely that she would have  resolution of symptoms with cholecystectomy. I predict that to be 80% or greater. At this point in time I do not necessarily think that further testing or especially consultation is in order. I did offer to refer her to a gastroenterologist. She knows that if we did cholecystectomy and she continued to have symptoms that she would need an extensive workup. She knows that I do not think this sounds like pulmonary disease, cardiac disease, peptic ulcer disease, pancreatic disease.      I discussed laparoscopic cholecystectomy with her and I discussed the indications, details, techniques, and risks of this surgery. She is aware of the risk of bleeding, infection, conversion to open laparotomy, port site hernia, pancreatitis, diarrhea, injury to adjacent organs with major reconstructive surgery, bile leak, and so on. Failure to resolve her symptoms. She understands all these issues. All of her questions were answered. She discussed this with her husband, called back and scheduled the surgery..   Past Surgical History  Thyroid Surgery   Diagnostic Studies History  Colonoscopy  never Mammogram  within last year Pap Smear  1-5 years ago  Allergies  No Known Drug Allergies  Allergies Reconciled   Medication History  Lo Loestrin Fe (1 MG-10 MCG /10 MCG Tablet, Oral) Active.  Social History  Alcohol use  Occasional alcohol use. Caffeine use  Carbonated beverages, Coffee, Tea. No drug use  Tobacco use  Never smoker.  Family History  Cancer  Mother. Cerebrovascular Accident  Sister. Melanoma  Father. Migraine Headache  Sister. Thyroid problems  Mother.  Pregnancy / Birth History  Age at menarche  22 years. Contraceptive  History  Oral contraceptives. Gravida  2 Length (months) of breastfeeding  7-12 Maternal age  22-35 Para  2 Regular periods   Other Problems  Back Pain  Hemorrhoids  Migraine Headache  Thyroid Disease     Review of Systems   General Not Present- Appetite Loss, Chills, Fatigue, Fever, Night Sweats, Weight Gain and Weight Loss. Skin Not Present- Change in Wart/Mole, Dryness, Hives, Jaundice, New Lesions, Non-Healing Wounds, Rash and Ulcer. HEENT Present- Ringing in the Ears. Not Present- Earache, Hearing Loss, Hoarseness, Nose Bleed, Oral Ulcers, Seasonal Allergies, Sinus Pain, Sore Throat, Visual Disturbances, Wears glasses/contact lenses and Yellow Eyes. Respiratory Not Present- Bloody sputum, Chronic Cough, Difficulty Breathing, Snoring and Wheezing. Breast Not Present- Breast Mass, Breast Pain, Nipple Discharge and Skin Changes. Cardiovascular Not Present- Chest Pain, Difficulty Breathing Lying Down, Leg Cramps, Palpitations, Rapid Heart Rate, Shortness of Breath and Swelling of Extremities. Gastrointestinal Present- Abdominal Pain and Nausea. Not Present- Bloating, Bloody Stool, Change in Bowel Habits, Chronic diarrhea, Constipation, Difficulty Swallowing, Excessive gas, Gets full quickly at meals, Hemorrhoids, Indigestion, Rectal Pain and Vomiting. Female Genitourinary Not Present- Frequency, Nocturia, Painful Urination, Pelvic Pain and Urgency. Musculoskeletal Not Present- Back Pain, Joint Pain, Joint Stiffness, Muscle Pain, Muscle Weakness and Swelling of Extremities. Neurological Not Present- Decreased Memory, Fainting, Headaches, Numbness, Seizures, Tingling, Tremor, Trouble walking and Weakness. Psychiatric Not Present- Anxiety, Bipolar, Change in Sleep Pattern, Depression, Fearful and Frequent crying. Endocrine Not Present- Cold Intolerance, Excessive Hunger, Hair Changes, Heat Intolerance, Hot flashes and New Diabetes. Hematology Not Present- Blood Thinners, Easy Bruising, Excessive bleeding, Gland problems, HIV and Persistent Infections.  Vitals  Weight: 142 lb Height: 61in Body Surface Area: 1.63 m Body Mass Index: 26.83 kg/m  Temp.: 98.16F  Pulse: 91 (Regular)  P.OX: 99% (Room  air)    Physical Exam  General Mental Status-Alert. General Appearance-Consistent with stated age. Hydration-Well hydrated. Voice-Normal.  Head and Neck Head-normocephalic, atraumatic with no lesions or palpable masses. Trachea-midline. Thyroid Gland Characteristics - normal size and consistency.  Eye Eyeball - Bilateral-Extraocular movements intact. Sclera/Conjunctiva - Bilateral-No scleral icterus.  Chest and Lung Exam Chest and lung exam reveals -quiet, even and easy respiratory effort with no use of accessory muscles and on auscultation, normal breath sounds, no adventitious sounds and normal vocal resonance. Inspection Chest Wall - Normal. Back - normal.  Cardiovascular Cardiovascular examination reveals -normal heart sounds, regular rate and rhythm with no murmurs and normal pedal pulses bilaterally.  Abdomen Inspection Inspection of the abdomen reveals - No Hernias. Skin - Scar - no surgical scars. Palpation/Percussion Palpation and Percussion of the abdomen reveal - Soft, Non Tender, No Rebound tenderness, No Rigidity (guarding) and No hepatosplenomegaly. Auscultation Auscultation of the abdomen reveals - Bowel sounds normal. Note: Benign exam today. No tenderness. No mass. No hernia. No scars.   Neurologic Neurologic evaluation reveals -alert and oriented x 3 with no impairment of recent or remote memory. Mental Status-Normal.  Musculoskeletal Normal Exam - Left-Upper Extremity Strength Normal and Lower Extremity Strength Normal. Normal Exam - Right-Upper Extremity Strength Normal and Lower Extremity Strength Normal.  Lymphatic Head & Neck  General Head & Neck Lymphatics: Bilateral - Description - Normal. Axillary  General Axillary Region: Bilateral - Description - Normal. Tenderness - Non Tender. Femoral & Inguinal  Generalized Femoral & Inguinal Lymphatics: Bilateral - Description - Normal. Tenderness - Non  Tender.    Assessment & Plan BILIARY DYSKINESIA (K82.8)   You have had fairly typical symptoms of gallbladder attacks for 10 months. Your  lab work is reportedly normal Your gallbladder ultrasound is normal Your hepatobiliary scan is abnormal and shows a decreased ejection fraction and also had pain after drinking the Ensure  We cannot be entirely certain, but most likely you have a disease of the gallbladder called biliary dyskinesia in which you can have gallbladder attacks without stones The probability of relief of symptoms with cholecystectomy in this setting is greater than 80% but not entirely certain You have taken proton pump inhibitors intermittently, but apparently that did not change anything We have discussed different algorithms for care with preop and postop You request that we proceed with surgery.    HISTORY OF THYROIDECTOMY, SUBTOTAL [phone])   Edsel Petrin. Dalbert Batman, M.D., [facility] General and Minimally invasive Surgery Breast and Colorectal Surgery Office:   [phone] Pager:   [phone]

## 2017-10-19 ENCOUNTER — Encounter (HOSPITAL_COMMUNITY): Payer: Self-pay

## 2017-10-19 NOTE — Pre-Procedure Instructions (Signed)
Crystal Gutierrez  10/19/2017      [id number] - [place], [place] [address] [place] [address] Phone: [phone] Fax: [phone]    Your procedure is scheduled on October 26  Report to [facility] at [place].  Call this number if you have problems the morning of surgery:  [phone]   Remember:  Do not eat food or drink liquids after midnight.  Please complete your 8oz of Boost Breeze or Water that was given to you at your preadmission appointment by 0430am on the [place] all other medications as directed by your physician except follow these medication instructions before surgery   Take these medicines the morning of surgery with A SIP OF WATER NONE  7 days prior to surgery STOP taking any Aspirin (unless otherwise instructed by your surgeon), Aleve, Naproxen, Ibuprofen, Motrin, Advil, Goody's, BC's, all herbal medications, fish oil, and all vitamins diclofenac (VOLTAREN)     Do not wear jewelry, make-up or nail polish.  Do not wear lotions, powders, or perfumes, or deoderant.  Do not shave 48 hours prior to surgery.  Men may shave face and neck.  Do not bring valuables to the hospital.  [facility] is not responsible for any belongings or valuables.  Contacts, dentures or bridgework may not be worn into surgery.  Leave your suitcase in the car.  After surgery it may be brought to your room.  For patients admitted to the hospital, discharge time will be determined by your treatment team.  Patients discharged the day of surgery will not be allowed to drive home.    Special instructions:   [facility]- Preparing For Surgery  Before surgery, you can play an important role. Because skin is not sterile, your skin needs to be as free of germs as possible. You can reduce the number of germs on your skin by washing with CHG (chlorahexidine gluconate)  Soap before surgery.  CHG is an antiseptic cleaner which kills germs and bonds with the skin to continue killing germs even after washing.  Please do not use if you have an allergy to CHG or antibacterial soaps. If your skin becomes reddened/irritated stop using the CHG.  Do not shave (including legs and underarms) for at least 48 hours prior to first CHG shower. It is OK to shave your face.  Please follow these instructions carefully.   1. Shower the NIGHT BEFORE SURGERY and the MORNING OF SURGERY with CHG.   2. If you chose to wash your hair, wash your hair first as usual with your normal shampoo.  3. After you shampoo, rinse your hair and body thoroughly to remove the shampoo.  4. Use CHG as you would any other liquid soap. You can apply CHG directly to the skin and wash gently with a scrungie or a clean washcloth.   5. Apply the CHG Soap to your body ONLY FROM THE NECK DOWN.  Do not use on open wounds or open sores. Avoid contact with your eyes, ears, mouth and genitals (private parts). Wash Face and genitals (private parts)  with your normal soap.  6. Wash thoroughly, paying special attention to the area where your surgery will be performed.  7. Thoroughly rinse your body with warm water from the neck down.  8. DO NOT shower/wash with your normal soap after using and rinsing off the CHG Soap.  9. Crystal Gutierrez  yourself dry with a CLEAN TOWEL.  10. Wear CLEAN PAJAMAS to bed the night before surgery, wear comfortable clothes the morning of surgery  11. Place CLEAN SHEETS on your bed the night of your first shower and DO NOT SLEEP WITH PETS.    Day of Surgery: Do not apply any deodorants/lotions. Please wear clean clothes to the hospital/surgery center.      Please read over the following fact sheets that you were given.

## 2017-10-20 ENCOUNTER — Encounter (HOSPITAL_COMMUNITY)
Admission: RE | Admit: 2017-10-20 | Discharge: 2017-10-20 | Disposition: A | Discharge status: Home / Self Care | Payer: 59 | Source: Ambulatory Visit | Attending: General Surgery | Admitting: General Surgery

## 2017-10-20 ENCOUNTER — Encounter (HOSPITAL_COMMUNITY): Payer: Self-pay

## 2017-10-20 DIAGNOSIS — K828 Other specified diseases of gallbladder: Secondary | ICD-10-CM | POA: Diagnosis present

## 2017-10-20 DIAGNOSIS — K811 Chronic cholecystitis: Secondary | ICD-10-CM | POA: Diagnosis not present

## 2017-10-20 DIAGNOSIS — Z885 Allergy status to narcotic agent status: Secondary | ICD-10-CM | POA: Diagnosis not present

## 2017-10-20 DIAGNOSIS — Z9889 Other specified postprocedural states: Secondary | ICD-10-CM | POA: Diagnosis not present

## 2017-10-20 LAB — CBC WITH DIFFERENTIAL/PLATELET
BASOS PCT: 1 %
Basophils Absolute: 0.1 10*3/uL (ref 0.0–0.1)
EOS ABS: 0.4 10*3/uL (ref 0.0–0.7)
EOS PCT: 5 %
HCT: 42.5 % (ref 36.0–46.0)
HEMOGLOBIN: 14.2 g/dL (ref 12.0–15.0)
Lymphocytes Relative: 34 %
Lymphs Abs: 2.4 10*3/uL (ref 0.7–4.0)
MCH: 29.5 pg (ref 26.0–34.0)
MCHC: 33.4 g/dL (ref 30.0–36.0)
MCV: 88.4 fL (ref 78.0–100.0)
Monocytes Absolute: 0.5 10*3/uL (ref 0.1–1.0)
Monocytes Relative: 7 %
NEUTROS PCT: 53 %
Neutro Abs: 3.7 10*3/uL (ref 1.7–7.7)
PLATELETS: 282 10*3/uL (ref 150–400)
RBC: 4.81 MIL/uL (ref 3.87–5.11)
RDW: 13.2 % (ref 11.5–15.5)
WBC: 7.1 10*3/uL (ref 4.0–10.5)

## 2017-10-20 LAB — COMPREHENSIVE METABOLIC PANEL
ALBUMIN: 3.9 g/dL (ref 3.5–5.0)
ALK PHOS: 67 U/L (ref 38–126)
ALT: 25 U/L (ref 14–54)
ANION GAP: 6 (ref 5–15)
AST: 26 U/L (ref 15–41)
BUN: 10 mg/dL (ref 6–20)
CALCIUM: 9.3 mg/dL (ref 8.9–10.3)
CHLORIDE: 107 mmol/L (ref 101–111)
CO2: 26 mmol/L (ref 22–32)
Creatinine, Ser: 0.79 mg/dL (ref 0.44–1.00)
GFR calc non Af Amer: >60 mL/min (ref >60)
GLUCOSE: 92 mg/dL (ref 65–99)
Potassium: 3.9 mmol/L (ref 3.5–5.1)
SODIUM: 139 mmol/L (ref 135–145)
Total Bilirubin: 0.8 mg/dL (ref 0.3–1.2)
Total Protein: 7.3 g/dL (ref 6.5–8.1)

## 2017-10-20 LAB — HCG, SERUM, QUALITATIVE: Preg, Serum: NEGATIVE

## 2017-10-20 NOTE — Progress Notes (Signed)
Pt. Followed by Dr. Deirdre Evener for PCP. Pt. Denies alll chest concerns, that aside she continues to reports a nagging sharp-achy gassy pain on her R side. No advanced cardiac testing in her past.

## 2017-10-23 ENCOUNTER — Encounter (HOSPITAL_COMMUNITY): Payer: Self-pay

## 2017-10-23 ENCOUNTER — Ambulatory Visit (HOSPITAL_COMMUNITY): Payer: 59 | Admitting: Anesthesiology

## 2017-10-23 ENCOUNTER — Encounter (HOSPITAL_COMMUNITY)
Admission: RE | Disposition: A | Discharge status: Home / Self Care | Payer: Self-pay | Source: Ambulatory Visit | Attending: General Surgery

## 2017-10-23 ENCOUNTER — Ambulatory Visit (HOSPITAL_COMMUNITY): Payer: 59

## 2017-10-23 ENCOUNTER — Ambulatory Visit (HOSPITAL_COMMUNITY)
Admission: RE | Admit: 2017-10-23 | Discharge: 2017-10-23 | Disposition: A | Discharge status: Home / Self Care | Payer: 59 | Source: Ambulatory Visit | Attending: General Surgery | Admitting: General Surgery

## 2017-10-23 DIAGNOSIS — Z9889 Other specified postprocedural states: Secondary | ICD-10-CM | POA: Insufficient documentation

## 2017-10-23 DIAGNOSIS — Z885 Allergy status to narcotic agent status: Secondary | ICD-10-CM | POA: Insufficient documentation

## 2017-10-23 DIAGNOSIS — K828 Other specified diseases of gallbladder: Secondary | ICD-10-CM

## 2017-10-23 DIAGNOSIS — K811 Chronic cholecystitis: Secondary | ICD-10-CM | POA: Diagnosis not present

## 2017-10-23 DIAGNOSIS — M545 Low back pain: Secondary | ICD-10-CM | POA: Diagnosis not present

## 2017-10-23 DIAGNOSIS — G2401 Drug induced subacute dyskinesia: Secondary | ICD-10-CM | POA: Diagnosis not present

## 2017-10-23 HISTORY — DX: Other specified diseases of gallbladder: K82.8

## 2017-10-23 HISTORY — PX: CHOLECYSTECTOMY: SHX55

## 2017-10-23 SURGERY — LAPAROSCOPIC CHOLECYSTECTOMY WITH INTRAOPERATIVE CHOLANGIOGRAM
Anesthesia: General | Site: Abdomen

## 2017-10-23 MED ORDER — FENTANYL CITRATE (PF) 100 MCG/2ML IJ SOLN: 25.0000 ug | INTRAMUSCULAR | Status: DC | PRN

## 2017-10-23 MED ORDER — LIDOCAINE 2% (20 MG/ML) 5 ML SYRINGE
INTRAMUSCULAR | Status: AC
  Filled 2017-10-23: qty 5

## 2017-10-23 MED ORDER — ACETAMINOPHEN 325 MG PO TABS: 650.0000 mg | ORAL_TABLET | ORAL | Status: DC | PRN

## 2017-10-23 MED ORDER — ROCURONIUM BROMIDE 100 MG/10ML IV SOLN
INTRAVENOUS | Status: DC | PRN
Start: 2017-10-23 — End: 2017-10-23
  Administered 2017-10-23: 45 mg via INTRAVENOUS

## 2017-10-23 MED ORDER — LIDOCAINE HCL (CARDIAC) 20 MG/ML IV SOLN
INTRAVENOUS | Status: DC | PRN
  Administered 2017-10-23: 80 mg via INTRAVENOUS

## 2017-10-23 MED ORDER — CEFAZOLIN SODIUM-DEXTROSE 2-4 GM/100ML-% IV SOLN
INTRAVENOUS | Status: AC
  Filled 2017-10-23: qty 100

## 2017-10-23 MED ORDER — HYDROMORPHONE HCL 1 MG/ML IJ SOLN
INTRAMUSCULAR | Status: AC
  Administered 2017-10-23: 0.5 mg via INTRAVENOUS
  Filled 2017-10-23: qty 1

## 2017-10-23 MED ORDER — SODIUM CHLORIDE 0.9 % IV SOLN
INTRAVENOUS | Status: DC | PRN
  Administered 2017-10-23: 4 mL

## 2017-10-23 MED ORDER — OXYCODONE HCL 5 MG PO TABS: 5.0000 mg | ORAL_TABLET | ORAL | Status: DC | PRN

## 2017-10-23 MED ORDER — CEFAZOLIN SODIUM-DEXTROSE 2-4 GM/100ML-% IV SOLN
2.0000 g | INTRAVENOUS | Status: AC
  Administered 2017-10-23: 2 g via INTRAVENOUS

## 2017-10-23 MED ORDER — LACTATED RINGERS IV SOLN
INTRAVENOUS | Status: DC
  Administered 2017-10-23 (×3): via INTRAVENOUS

## 2017-10-23 MED ORDER — IOPAMIDOL (ISOVUE-300) INJECTION 61%
INTRAVENOUS | Status: AC
  Filled 2017-10-23: qty 50

## 2017-10-23 MED ORDER — BUPIVACAINE-EPINEPHRINE 0.5% -1:200000 IJ SOLN
INTRAMUSCULAR | Status: DC | PRN
  Administered 2017-10-23: 14 mL

## 2017-10-23 MED ORDER — LACTATED RINGERS IV SOLN: INTRAVENOUS | Status: DC

## 2017-10-23 MED ORDER — SUCCINYLCHOLINE CHLORIDE 200 MG/10ML IV SOSY
PREFILLED_SYRINGE | INTRAVENOUS | Status: AC
  Filled 2017-10-23: qty 10

## 2017-10-23 MED ORDER — DEXAMETHASONE SODIUM PHOSPHATE 10 MG/ML IJ SOLN
INTRAMUSCULAR | Status: DC | PRN
  Administered 2017-10-23: 10 mg via INTRAVENOUS

## 2017-10-23 MED ORDER — GABAPENTIN 300 MG PO CAPS
ORAL_CAPSULE | ORAL | Status: AC
  Administered 2017-10-23: 300 mg via ORAL
  Filled 2017-10-23: qty 1

## 2017-10-23 MED ORDER — FENTANYL CITRATE (PF) 100 MCG/2ML IJ SOLN
INTRAMUSCULAR | Status: DC | PRN
  Administered 2017-10-23 (×2): 50 ug via INTRAVENOUS
  Administered 2017-10-23 (×2): 25 ug via INTRAVENOUS
  Administered 2017-10-23 (×2): 50 ug via INTRAVENOUS

## 2017-10-23 MED ORDER — HYDROCODONE-ACETAMINOPHEN 5-325 MG PO TABS: 1.0000 | ORAL_TABLET | Freq: Four times a day (QID) | ORAL | 0 refills | Status: DC | PRN

## 2017-10-23 MED ORDER — ACETAMINOPHEN 500 MG PO TABS
ORAL_TABLET | ORAL | Status: AC
  Administered 2017-10-23: 1000 mg via ORAL
  Filled 2017-10-23: qty 2

## 2017-10-23 MED ORDER — SUGAMMADEX SODIUM 200 MG/2ML IV SOLN
INTRAVENOUS | Status: DC | PRN
  Administered 2017-10-23: 130.2 mg via INTRAVENOUS

## 2017-10-23 MED ORDER — ONDANSETRON HCL 4 MG/2ML IJ SOLN
INTRAMUSCULAR | Status: DC | PRN
  Administered 2017-10-23: 4 mg via INTRAVENOUS

## 2017-10-23 MED ORDER — PROPOFOL 10 MG/ML IV BOLUS
INTRAVENOUS | Status: AC
  Filled 2017-10-23: qty 40

## 2017-10-23 MED ORDER — CHLORHEXIDINE GLUCONATE CLOTH 2 % EX PADS: 6.0000 | MEDICATED_PAD | Freq: Once | CUTANEOUS | Status: DC

## 2017-10-23 MED ORDER — OXYCODONE HCL 5 MG/5ML PO SOLN: 5.0000 mg | Freq: Once | ORAL | Status: AC | PRN

## 2017-10-23 MED ORDER — CELECOXIB 200 MG PO CAPS
200.0000 mg | ORAL_CAPSULE | ORAL | Status: AC
  Administered 2017-10-23: 200 mg via ORAL

## 2017-10-23 MED ORDER — PHENYLEPHRINE 40 MCG/ML (10ML) SYRINGE FOR IV PUSH (FOR BLOOD PRESSURE SUPPORT)
PREFILLED_SYRINGE | INTRAVENOUS | Status: AC
  Filled 2017-10-23: qty 30

## 2017-10-23 MED ORDER — HYDROMORPHONE HCL 1 MG/ML IJ SOLN
0.2500 mg | INTRAMUSCULAR | Status: DC | PRN
  Administered 2017-10-23 (×2): 0.5 mg via INTRAVENOUS

## 2017-10-23 MED ORDER — 0.9 % SODIUM CHLORIDE (POUR BTL) OPTIME
TOPICAL | Status: DC | PRN
  Administered 2017-10-23: 1000 mL

## 2017-10-23 MED ORDER — SODIUM CHLORIDE 0.9 % IR SOLN
Status: DC | PRN
  Administered 2017-10-23: 1000 mL

## 2017-10-23 MED ORDER — CELECOXIB 200 MG PO CAPS
ORAL_CAPSULE | ORAL | Status: AC
  Administered 2017-10-23: 200 mg via ORAL
  Filled 2017-10-23: qty 1

## 2017-10-23 MED ORDER — MIDAZOLAM HCL 2 MG/2ML IJ SOLN
INTRAMUSCULAR | Status: AC
  Filled 2017-10-23: qty 2

## 2017-10-23 MED ORDER — ACETAMINOPHEN 500 MG PO TABS
1000.0000 mg | ORAL_TABLET | ORAL | Status: AC
Start: 2017-10-23 — End: 2017-10-23
  Administered 2017-10-23: 1000 mg via ORAL

## 2017-10-23 MED ORDER — SODIUM CHLORIDE 0.9% FLUSH: 3.0000 mL | INTRAVENOUS | Status: DC | PRN

## 2017-10-23 MED ORDER — MIDAZOLAM HCL 5 MG/5ML IJ SOLN
INTRAMUSCULAR | Status: DC | PRN
  Administered 2017-10-23 (×2): 1 mg via INTRAVENOUS

## 2017-10-23 MED ORDER — ROCURONIUM BROMIDE 10 MG/ML (PF) SYRINGE
PREFILLED_SYRINGE | INTRAVENOUS | Status: AC
  Filled 2017-10-23: qty 5

## 2017-10-23 MED ORDER — PROPOFOL 10 MG/ML IV BOLUS
INTRAVENOUS | Status: DC | PRN
  Administered 2017-10-23: 140 mg via INTRAVENOUS
  Administered 2017-10-23: 60 mg via INTRAVENOUS

## 2017-10-23 MED ORDER — EPHEDRINE 5 MG/ML INJ
INTRAVENOUS | Status: AC
  Filled 2017-10-23: qty 10

## 2017-10-23 MED ORDER — OXYCODONE HCL 5 MG PO TABS
5.0000 mg | ORAL_TABLET | Freq: Once | ORAL | Status: AC | PRN
  Administered 2017-10-23: 5 mg via ORAL

## 2017-10-23 MED ORDER — FENTANYL CITRATE (PF) 250 MCG/5ML IJ SOLN
INTRAMUSCULAR | Status: AC
  Filled 2017-10-23: qty 5

## 2017-10-23 MED ORDER — SODIUM CHLORIDE 0.9 % IV SOLN: 250.0000 mL | INTRAVENOUS | Status: DC | PRN

## 2017-10-23 MED ORDER — OXYCODONE HCL 5 MG PO TABS
ORAL_TABLET | ORAL | Status: AC
  Filled 2017-10-23: qty 1

## 2017-10-23 MED ORDER — BUPIVACAINE-EPINEPHRINE (PF) 0.25% -1:200000 IJ SOLN
INTRAMUSCULAR | Status: AC
  Filled 2017-10-23: qty 30

## 2017-10-23 MED ORDER — ACETAMINOPHEN 650 MG RE SUPP: 650.0000 mg | RECTAL | Status: DC | PRN

## 2017-10-23 MED ORDER — GABAPENTIN 300 MG PO CAPS
300.0000 mg | ORAL_CAPSULE | ORAL | Status: AC
  Administered 2017-10-23: 300 mg via ORAL

## 2017-10-23 MED ORDER — SODIUM CHLORIDE 0.9% FLUSH: 3.0000 mL | Freq: Two times a day (BID) | INTRAVENOUS | Status: DC

## 2017-10-23 SURGICAL SUPPLY — 35 items
APPLIER CLIP ROT 10 11.4 M/L (STAPLE) ×2
BLADE CLIPPER SURG (BLADE) IMPLANT
CANISTER SUCT 3000ML PPV (MISCELLANEOUS) ×2 IMPLANT
CHLORAPREP W/TINT 26ML (MISCELLANEOUS) ×2 IMPLANT
CLIP APPLIE ROT 10 11.4 M/L (STAPLE) ×1 IMPLANT
COVER MAYO STAND STRL (DRAPES) ×2 IMPLANT
COVER SURGICAL LIGHT HANDLE (MISCELLANEOUS) ×2 IMPLANT
DERMABOND ADVANCED (GAUZE/BANDAGES/DRESSINGS) ×1
DERMABOND ADVANCED .7 DNX12 (GAUZE/BANDAGES/DRESSINGS) ×1 IMPLANT
DRAPE C-ARM 42X72 X-RAY (DRAPES) ×2 IMPLANT
ELECT REM PT RETURN 9FT ADLT (ELECTROSURGICAL) ×2
ELECTRODE REM PT RTRN 9FT ADLT (ELECTROSURGICAL) ×1 IMPLANT
GLOVE EUDERMIC 7 POWDERFREE (GLOVE) ×2 IMPLANT
GOWN STRL REUS W/ TWL LRG LVL3 (GOWN DISPOSABLE) ×2 IMPLANT
GOWN STRL REUS W/ TWL XL LVL3 (GOWN DISPOSABLE) ×1 IMPLANT
GOWN STRL REUS W/TWL LRG LVL3 (GOWN DISPOSABLE) ×2
GOWN STRL REUS W/TWL XL LVL3 (GOWN DISPOSABLE) ×1
KIT BASIN OR (CUSTOM PROCEDURE TRAY) ×2 IMPLANT
KIT ROOM TURNOVER OR (KITS) ×2 IMPLANT
NS IRRIG 1000ML POUR BTL (IV SOLUTION) ×2 IMPLANT
PAD ARMBOARD 7.5X6 YLW CONV (MISCELLANEOUS) ×2 IMPLANT
POUCH SPECIMEN RETRIEVAL 10MM (ENDOMECHANICALS) ×2 IMPLANT
SCISSORS LAP 5X35 DISP (ENDOMECHANICALS) ×2 IMPLANT
SET CHOLANGIOGRAPH 5 50 .035 (SET/KITS/TRAYS/PACK) ×2 IMPLANT
SET IRRIG TUBING LAPAROSCOPIC (IRRIGATION / IRRIGATOR) ×2 IMPLANT
SLEEVE ENDOPATH XCEL 5M (ENDOMECHANICALS) ×2 IMPLANT
SPECIMEN JAR SMALL (MISCELLANEOUS) ×2 IMPLANT
SUT MNCRL AB 4-0 PS2 18 (SUTURE) ×2 IMPLANT
TOWEL OR 17X24 6PK STRL BLUE (TOWEL DISPOSABLE) ×2 IMPLANT
TOWEL OR 17X26 10 PK STRL BLUE (TOWEL DISPOSABLE) ×2 IMPLANT
TRAY LAPAROSCOPIC MC (CUSTOM PROCEDURE TRAY) ×2 IMPLANT
TROCAR XCEL BLUNT TIP 100MML (ENDOMECHANICALS) ×2 IMPLANT
TROCAR XCEL NON-BLD 11X100MML (ENDOMECHANICALS) ×2 IMPLANT
TROCAR XCEL NON-BLD 5MMX100MML (ENDOMECHANICALS) ×2 IMPLANT
TUBING INSUFFLATION (TUBING) ×2 IMPLANT

## 2017-10-23 NOTE — Anesthesia Postprocedure Evaluation (Signed)
Anesthesia Post Note  Patient: Claiborne Billings Saraceno  Procedure(s) Performed: LAPAROSCOPIC CHOLECYSTECTOMY WITH INTRAOPERATIVE CHOLANGIOGRAM (N/A Abdomen)     Patient location during evaluation: PACU Anesthesia Type: General Level of consciousness: awake and alert Pain management: pain level controlled Vital Signs Assessment: post-procedure vital signs reviewed and stable Respiratory status: spontaneous breathing, nonlabored ventilation, respiratory function stable and patient connected to nasal cannula oxygen Cardiovascular status: blood pressure returned to baseline and stable Postop Assessment: no apparent nausea or vomiting Anesthetic complications: no    Last Vitals:  Vitals:   10/23/17 0930 10/23/17 0945  BP: 95/74   Pulse: (!) 51 (!) 52  Resp: 15 15  Temp:  36.6 C  SpO2: 97% 99%    Last Pain:  Vitals:   10/23/17 0932  TempSrc:   PainSc: 3                  Rozlynn Lippold,JAMES TERRILL

## 2017-10-23 NOTE — Discharge Instructions (Signed)
[facility] ______[facility] °LAPAROSCOPIC SURGERY: POST OP INSTRUCTIONS °Always review your discharge instruction sheet given to you by the facility where your surgery was performed. °IF YOU HAVE DISABILITY OR FAMILY LEAVE FORMS, YOU MUST BRING THEM TO THE OFFICE FOR PROCESSING.   °DO NOT GIVE THEM TO YOUR DOCTOR. ° °1. A prescription for pain medication may be given to you upon discharge.  Take your pain medication as prescribed, if needed.  If narcotic pain medicine is not needed, then you may take acetaminophen (Tylenol) or ibuprofen (Advil) as needed. °2. Take your usually prescribed medications unless otherwise directed. °3. If you need a refill on your pain medication, please contact your pharmacy.  They will contact our office to request authorization. Prescriptions will not be filled after 5pm or on week-ends. °4. You should follow a light diet the first few days after arrival home, such as soup and crackers, etc.  Be sure to include lots of fluids daily. °5. Most patients will experience some swelling and bruising in the area of the incisions.  Ice packs will help.  Swelling and bruising can take several days to resolve.  °6. It is common to experience some constipation if taking pain medication after surgery.  Increasing fluid intake and taking a stool softener (such as Colace) will usually help or prevent this problem from occurring.  A mild laxative (Milk of Magnesia or Miralax) should be taken according to package instructions if there are no bowel movements after 48 hours. °7. Unless discharge instructions indicate otherwise, you may remove your bandages 24-48 hours after surgery, and you may shower at that time.  You may have steri-strips (small skin tapes) in place directly over the incision.  These strips should be left on the skin for 7-10 days.  If your surgeon used skin glue on the incision, you may shower in 24 hours.  The glue will flake off over the next 2-3 weeks.  Any sutures or  staples will be removed at the office during your follow-up visit. °8. ACTIVITIES:  You may resume regular (light) daily activities beginning the next day--such as daily self-care, walking, climbing stairs--gradually increasing activities as tolerated.  You may have sexual intercourse when it is comfortable.  Refrain from any heavy lifting or straining until approved by your doctor. °a. You may drive when you are no longer taking prescription pain medication, you can comfortably wear a seatbelt, and you can safely maneuver your car and apply brakes. °b. RETURN TO WORK:  __________________________________________________________ °9. You should see your doctor in the office for a follow-up appointment approximately 2-3 weeks after your surgery.  Make sure that you call for this appointment within a day or two after you arrive home to insure a convenient appointment time. °10. OTHER INSTRUCTIONS: __________________________________________________________________________________________________________________________ __________________________________________________________________________________________________________________________ °WHEN TO CALL YOUR DOCTOR: °1. Fever over 101.0 °2. Inability to urinate °3. Continued bleeding from incision. °4. Increased pain, redness, or drainage from the incision. °5. Increasing abdominal pain ° °The clinic staff is available to answer your questions during regular business hours.  Please don’t hesitate to call and ask to speak to one of the nurses for clinical concerns.  If you have a medical emergency, go to the nearest emergency room or call 911.  A surgeon from [facility] is always on call at the hospital. °[address], [address], [place], [place]  [address] ? [address] [address], [place],    [address] °[phone] ? [phone] ? FAX [phone] °Web site:   [email or web address] °

## 2017-10-23 NOTE — Anesthesia Procedure Notes (Signed)
Procedure Name: Intubation Date/Time: 10/23/2017 7:39 AM Performed by: Scheryl Darter Pre-anesthesia Checklist: Patient identified, Emergency Drugs available, Suction available and Patient being monitored Patient Re-evaluated:Patient Re-evaluated prior to induction Oxygen Delivery Method: Circle System Utilized Preoxygenation: Pre-oxygenation with 100% oxygen Induction Type: IV induction Ventilation: Mask ventilation without difficulty Laryngoscope Size: Miller and 2 Grade View: Grade I Tube type: Oral Tube size: 7.0 mm Number of attempts: 1 Airway Equipment and Method: Stylet and Oral airway Placement Confirmation: ETT inserted through vocal cords under direct vision,  positive ETCO2 and breath sounds checked- equal and bilateral Secured at: 21 cm Tube secured with: Tape Dental Injury: Teeth and Oropharynx as per pre-operative assessment

## 2017-10-23 NOTE — Transfer of Care (Signed)
Immediate Anesthesia Transfer of Care Note  Patient: Crystal Gutierrez  Procedure(s) Performed: LAPAROSCOPIC CHOLECYSTECTOMY WITH INTRAOPERATIVE CHOLANGIOGRAM (N/A Abdomen)  Patient Location: PACU  Anesthesia Type:General  Level of Consciousness: awake, alert , oriented and sedated  Airway & Oxygen Therapy: Patient Spontanous Breathing and Patient connected to nasal cannula oxygen  Post-op Assessment: Report given to RN, Post -op Vital signs reviewed and stable and Patient moving all extremities  Post vital signs: Reviewed and stable  Last Vitals:  Vitals:   10/23/17 0647 10/23/17 0847  BP: 105/60 110/64  Pulse: 77 61  Resp: 18 14  Temp: 36.6 C 36.5 C  SpO2: 97% 100%    Last Pain:  Vitals:   10/23/17 0647  TempSrc: Oral  PainSc: 2          Complications: No apparent anesthesia complications

## 2017-10-23 NOTE — Anesthesia Preprocedure Evaluation (Signed)
Anesthesia Evaluation  Patient identified by MRN, date of birth, ID band Patient awake    Reviewed: Allergy & Precautions, NPO status , Patient's Chart, lab work & pertinent test results  Airway Mallampati: II   Neck ROM: Full    Dental   Pulmonary neg pulmonary ROS,    breath sounds clear to auscultation       Cardiovascular negative cardio ROS   Rhythm:Regular Rate:Normal     Neuro/Psych negative neurological ROS  negative psych ROS   GI/Hepatic Neg liver ROS,   Endo/Other  negative endocrine ROS  Renal/GU negative Renal ROS  negative genitourinary   Musculoskeletal negative musculoskeletal ROS (+)   Abdominal   Peds negative pediatric ROS (+)  Hematology negative hematology ROS (+)   Anesthesia Other Findings   Reproductive/Obstetrics negative OB ROS                             Anesthesia Physical Anesthesia Plan  ASA: II  Anesthesia Plan: General   Post-op Pain Management:    Induction: Intravenous  PONV Risk Score and Plan: 4 or greater and Ondansetron, Dexamethasone, Midazolam, Scopolamine patch - Pre-op, Propofol infusion and Treatment may vary due to age or medical condition  Airway Management Planned: Oral ETT  Additional Equipment:   Intra-op Plan:   Post-operative Plan:   Informed Consent: I have reviewed the patients History and Physical, chart, labs and discussed the procedure including the risks, benefits and alternatives for the proposed anesthesia with the patient or authorized representative who has indicated his/her understanding and acceptance.   Dental advisory given  Plan Discussed with: CRNA  Anesthesia Plan Comments:         Anesthesia Quick Evaluation

## 2017-10-23 NOTE — Op Note (Signed)
Patient Name:           Crystal Gutierrez   Date of Surgery:        10/23/2017  Pre op Diagnosis:      Biliary dyskinesia  Post op Diagnosis:    Same  Procedure:                 Laparoscopic cholecystectomy with cholangiogram  Surgeon:                     Edsel Petrin. Dalbert Batman, M.D., FACS  Assistant:                      RNFA   Indication for Assistant: Complex exposure  Operative Indications:   . This is a very pleasant 46 year old female, referred by Dr. Rachell Cipro for right upper quadrant pain and abnormal hepatobiliary scan.      Since November 2017 she's had intermittent attacks of right upper quadrant pain. This is clearly related to meals and she is now avoiding fried foods. The pain will sometimes last for a few days. She gets nauseated but does not vomit. No alteration of bowel habits. Initially she was evaluated at [facility] in March 2018. Reportedly lab work was normal. I do have a gallbladder ultrasound which shows normal gallbladder. CBD 3 mm. Normal liver. Normal aorta. Normal kidneys. Normal pancreas and spleen. She has tried proton pump inhibitors intermittently but that doesn't seem to alter her symptoms. She saw Dr. Ernie Hew in September 2018.  Hepatobiliary scan with ejection fraction is abnormal. Ejection fraction was 26% normal being greater than 33%. She also states that she had pain when she drank the ensure meal.     Comorbidities are minimal. She is healthy.     Family history is negative for biliary tract disease.       We had a long talk. I reviewed the patient information booklet. I told her that her symptoms coupled with her abnormal HIDA scan and absence of other disease process makes it fairly likely that she would have resolution of symptoms with cholecystectomy. I predict that to be 80% or greater. At this point in time I do not necessarily think that further testing or especially consultation is in order. I did offer to refer her to  a gastroenterologist. She knows that if we did cholecystectomy and she continued to have symptoms that she would need an extensive workup. She knows that I do not think this sounds like pulmonary disease, cardiac disease, peptic ulcer disease, pancreatic disease.      I discussed laparoscopic cholecystectomy with her and I discussed the indications, details, techniques, and risks of this surgery.  She decided to go ahead with surgery and is brought to the operating room electively.  Preop lab work is normal..  Operative Findings:       There were chronic omental adhesions adherent to the infundibulum and lower body of the gallbladder, suggesting prior inflammatory episodes.  The gallbladder was thin-walled.  The liver, stomach, duodenum, large intestine, and small intestine were grossly normal.  The anatomy of the cystic duct cystic artery and common bile duct were conventional.  The cystic duct was long and tiny.  The cholangiogram was basically normal, showing normal intrahepatic and extrahepatic biliary anatomy, no dilatation, no filling defect, and no obstruction with good flow of contrast into the duodenum.  Procedure in Detail:          Following the induction  of general endotracheal anesthesia the patient's abdomen was prepped and draped in a sterile fashion, surgical timeout performed, intravenous antibiotics were given.  0.5% Marcaine with epinephrine was used as a local infiltration anesthetic.     A Hassan trocar was inserted in the midline at the lower rim of the umbilicus with an open technique.  This was secured with the Purstring suture of 0 Vicryl.  Pneumoperitoneum was created  Video camera was inserted.  Four-quadrant exploration showed no evidence of bleeding or intestinal injury.  The patient was positioned and trochars placed in the subxiphoid region and right upper quadrant.  Gallbladder  fundus was elevated.  We slowly dissected the omental adhesions off of the gallbladder and exposed  the infundibulum.  We incised the peritoneum on the neck of the gallbladder and dissected out the cystic duct and cystic artery, creating a large critical view of safety behind these 2 structures.  A cholangiogram catheter was inserted into the cystic duct and a cholangiogram obtained using the C-arm.  The cholangiogram was normal as described above.  The cholangiogram catheter was removed, the cystic duct and cystic artery secured with multiple medical clips and divided.  Gallbladder was dissected from its bed with electrocautery, placed in a specimen bag and removed.  The operative field was irrigated and inspected.  Hemostasis was excellent.  There was no bile leak.  The trochars were removed under direct vision there was no bleeding.  The pneumoperitoneum was released.  The fascia at the umbilicus was closed with 0 Vicryl sutures and the skin closed with subcuticular 4-0 Monocryl and Dermabond.  The patient tolerated the procedure well was taken to PACU in stable condition.  EBL 10 mL.  Counts correct.  Complications none.     Edsel Petrin. Dalbert Batman, M.D., FACS General and Minimally Invasive Surgery Breast and Colorectal Surgery    Addendum: I logged onto the [facility]  website and reviewed her prescription medication history  10/23/2017 8:34 AM

## 2017-10-23 NOTE — Interval H&P Note (Signed)
History and Physical Interval Note:  10/23/2017 8:33 AM  Crystal Gutierrez  has presented today for surgery, with the diagnosis of BILIARY DYSKINESIA  The various methods of treatment have been discussed with the patient and family. After consideration of risks, benefits and other options for treatment, the patient has consented to  Procedure(s): LAPAROSCOPIC CHOLECYSTECTOMY WITH INTRAOPERATIVE CHOLANGIOGRAM (N/A) as a surgical intervention .  The patient's history has been reviewed, patient examined, no change in status, stable for surgery.  I have reviewed the patient's chart and labs.  Questions were answered to the patient's satisfaction.     Adin Hector

## 2017-10-24 ENCOUNTER — Encounter (HOSPITAL_COMMUNITY): Payer: Self-pay | Admitting: General Surgery

## 2018-02-16 DIAGNOSIS — J069 Acute upper respiratory infection, unspecified: Secondary | ICD-10-CM | POA: Diagnosis not present

## 2018-02-23 DIAGNOSIS — R05 Cough: Secondary | ICD-10-CM | POA: Diagnosis not present

## 2018-02-23 DIAGNOSIS — J069 Acute upper respiratory infection, unspecified: Secondary | ICD-10-CM | POA: Diagnosis not present

## 2018-07-16 DIAGNOSIS — Z0000 Encounter for general adult medical examination without abnormal findings: Secondary | ICD-10-CM | POA: Diagnosis not present

## 2018-07-20 DIAGNOSIS — Z23 Encounter for immunization: Secondary | ICD-10-CM | POA: Diagnosis not present

## 2018-07-20 DIAGNOSIS — M6289 Other specified disorders of muscle: Secondary | ICD-10-CM | POA: Diagnosis not present

## 2018-07-20 DIAGNOSIS — Z0000 Encounter for general adult medical examination without abnormal findings: Secondary | ICD-10-CM | POA: Diagnosis not present

## 2018-08-18 DIAGNOSIS — Z23 Encounter for immunization: Secondary | ICD-10-CM | POA: Diagnosis not present

## 2019-03-18 DIAGNOSIS — J029 Acute pharyngitis, unspecified: Secondary | ICD-10-CM | POA: Diagnosis not present

## 2019-03-18 DIAGNOSIS — Z719 Counseling, unspecified: Secondary | ICD-10-CM | POA: Diagnosis not present

## 2019-03-25 DIAGNOSIS — J029 Acute pharyngitis, unspecified: Secondary | ICD-10-CM | POA: Diagnosis not present

## 2019-03-25 DIAGNOSIS — Z719 Counseling, unspecified: Secondary | ICD-10-CM | POA: Diagnosis not present

## 2019-08-30 ENCOUNTER — Other Ambulatory Visit: Payer: Self-pay | Admitting: Emergency Medicine

## 2019-08-30 DIAGNOSIS — Z20822 Contact with and (suspected) exposure to covid-19: Secondary | ICD-10-CM

## 2019-08-31 LAB — NOVEL CORONAVIRUS, NAA: SARS-CoV-2, NAA: NOT DETECTED

## 2019-09-09 IMAGING — NM NM HEPATO W/GB/PHARM/[PERSON_NAME]
2 series · 12 of 12 positions shown · non-contrast
Comparison: None.

CLINICAL DATA: Right upper quadrant pain.

EXAM:
NUCLEAR MEDICINE HEPATOBILIARY IMAGING WITH GALLBLADDER EF
TECHNIQUE: Sequential images of the abdomen were obtained [DATE] minutes
following intravenous administration of radiopharmaceutical. After
oral ingestion of Ensure, gallbladder ejection fraction was
determined. At 60 min, normal ejection fraction is greater than 33%.
RADIOPHARMACEUTICALS:  5.4 mCi Zc-AAm  Choletec IV

[he hepatobiliary · 3.43mm/px · 6 of 60 frames shown (1 of 2)]
[frame 6/60]
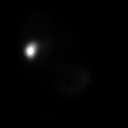
[frame 16/60]
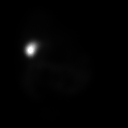
[frame 26/60]
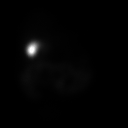
[frame 36/60]
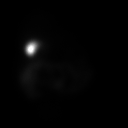
[frame 46/60]
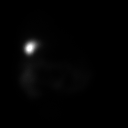
[frame 56/60]
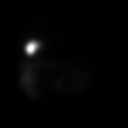

[he hepatobiliary · 3.43mm/px · 6 of 60 frames shown (2 of 2)]
[frame 6/60]
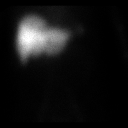
[frame 16/60]
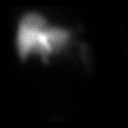
[frame 26/60]
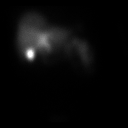
[frame 36/60]
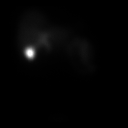
[frame 46/60]
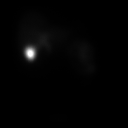
[frame 56/60]
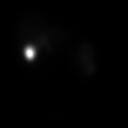

[12 of 12 positions shown; findings below may reference images not displayed]

FINDINGS: Prompt uptake and biliary excretion of activity by the liver is
seen. Gallbladder activity is visualized, consistent with patency of
cystic duct. Biliary activity passes into small bowel, consistent
with patent common bile duct.

Calculated gallbladder ejection fraction is 26%. (Normal gallbladder
ejection fraction with Ensure is greater than 33%.)
IMPRESSION: Cystic duct and common bile duct are patent.

Abnormally low gallbladder ejection fracture of 26%.

## 2019-10-24 ENCOUNTER — Other Ambulatory Visit: Payer: Self-pay

## 2019-10-24 DIAGNOSIS — Z20822 Contact with and (suspected) exposure to covid-19: Secondary | ICD-10-CM

## 2019-10-26 LAB — NOVEL CORONAVIRUS, NAA: SARS-CoV-2, NAA: NOT DETECTED

## 2020-10-22 ENCOUNTER — Other Ambulatory Visit: Payer: 59

## 2020-10-22 DIAGNOSIS — Z20822 Contact with and (suspected) exposure to covid-19: Secondary | ICD-10-CM

## 2020-10-23 LAB — SARS-COV-2, NAA 2 DAY TAT

## 2020-10-23 LAB — NOVEL CORONAVIRUS, NAA: SARS-CoV-2, NAA: NOT DETECTED

## 2021-01-17 ENCOUNTER — Other Ambulatory Visit: Payer: 59

## 2021-01-17 DIAGNOSIS — Z20822 Contact with and (suspected) exposure to covid-19: Secondary | ICD-10-CM

## 2021-01-19 LAB — SARS-COV-2, NAA 2 DAY TAT

## 2021-01-19 LAB — NOVEL CORONAVIRUS, NAA: SARS-CoV-2, NAA: NOT DETECTED

## 2021-07-12 ENCOUNTER — Other Ambulatory Visit: Payer: Self-pay | Admitting: Family Medicine

## 2021-07-12 DIAGNOSIS — E559 Vitamin D deficiency, unspecified: Secondary | ICD-10-CM

## 2021-08-06 ENCOUNTER — Other Ambulatory Visit: Payer: Self-pay

## 2021-08-06 ENCOUNTER — Ambulatory Visit
Admission: RE | Admit: 2021-08-06 | Discharge: 2021-08-06 | Disposition: A | Discharge status: Home / Self Care | Payer: 59 | Source: Ambulatory Visit | Attending: Family Medicine | Admitting: Family Medicine

## 2021-08-06 DIAGNOSIS — E559 Vitamin D deficiency, unspecified: Secondary | ICD-10-CM

## 2021-11-12 ENCOUNTER — Encounter: Payer: Self-pay | Admitting: Gastroenterology

## 2022-01-03 ENCOUNTER — Other Ambulatory Visit: Payer: Self-pay

## 2022-01-03 ENCOUNTER — Ambulatory Visit (AMBULATORY_SURGERY_CENTER): Payer: Self-pay | Admitting: *Deleted

## 2022-01-03 VITALS — Ht 61.0 in | Wt 134.0 lb

## 2022-01-03 DIAGNOSIS — Z1211 Encounter for screening for malignant neoplasm of colon: Secondary | ICD-10-CM

## 2022-01-03 MED ORDER — NA SULFATE-K SULFATE-MG SULF 17.5-3.13-1.6 GM/177ML PO SOLN: 1.0000 | Freq: Once | ORAL | 0 refills | Status: AC

## 2022-01-03 NOTE — Progress Notes (Signed)
No egg or soy allergy known to patient  No issues known to pt with past sedation with any surgeries or procedures Patient denies ever being told they had issues or difficulty with intubation  No FH of Malignant Hyperthermia Pt is not on diet pills Pt is not on  home 02  Pt is not on blood thinners  Pt denies issues with constipation  No A fib or A flutter  Pt is fully vaccinated  for Covid   NO PA's for preps discussed with pt In PV today  Discussed with pt there will be an out-of-pocket cost for prep and that varies from $0 to 70 +  dollars - pt verbalized understanding   Due to the COVID-19 pandemic we are asking patients to follow certain guidelines in PV and the [place]   Pt aware of COVID protocols and [facility] guidelines   PV completed over the phone. Pt verified name, DOB, address and insurance during PV today.  Pt mailed instruction packet with copy of consent form to read and not return, and instructions.   Pt encouraged to call with questions or issues.  If pt has My chart, procedure instructions sent via My Chart

## 2022-01-10 ENCOUNTER — Encounter: Payer: Self-pay | Admitting: Gastroenterology

## 2022-01-17 ENCOUNTER — Ambulatory Visit (AMBULATORY_SURGERY_CENTER): Payer: 59 | Admitting: Gastroenterology

## 2022-01-17 ENCOUNTER — Encounter: Payer: Self-pay | Admitting: Gastroenterology

## 2022-01-17 VITALS — BP 101/55 | HR 60 | Temp 98.0°F | Resp 9 | Ht 61.0 in | Wt 134.0 lb

## 2022-01-17 DIAGNOSIS — D125 Benign neoplasm of sigmoid colon: Secondary | ICD-10-CM

## 2022-01-17 DIAGNOSIS — Z1211 Encounter for screening for malignant neoplasm of colon: Secondary | ICD-10-CM | POA: Diagnosis not present

## 2022-01-17 DIAGNOSIS — D123 Benign neoplasm of transverse colon: Secondary | ICD-10-CM

## 2022-01-17 DIAGNOSIS — K635 Polyp of colon: Secondary | ICD-10-CM | POA: Diagnosis not present

## 2022-01-17 HISTORY — PX: COLONOSCOPY: SHX174

## 2022-01-17 MED ORDER — SODIUM CHLORIDE 0.9 % IV SOLN: 500.0000 mL | Freq: Once | INTRAVENOUS | Status: DC

## 2022-01-17 NOTE — Progress Notes (Signed)
Called to room to assist during endoscopic procedure.  Patient ID and intended procedure confirmed with present staff. Received instructions for my participation in the procedure from the performing physician.  

## 2022-01-17 NOTE — Patient Instructions (Signed)
Handouts given for polyps.  Continue present medications.  Await pathology results from Dr. Tarri Glenn.  YOU HAD AN ENDOSCOPIC PROCEDURE TODAY AT [facility]:   Refer to the procedure report that was given to you for any specific questions about what was found during the examination.  If the procedure report does not answer your questions, please call your gastroenterologist to clarify.  If you requested that your care partner not be given the details of your procedure findings, then the procedure report has been included in a sealed envelope for you to review at your convenience later.  YOU SHOULD EXPECT: Some feelings of bloating in the abdomen. Passage of more gas than usual.  Walking can help get rid of the air that was put into your GI tract during the procedure and reduce the bloating. If you had a lower endoscopy (such as a colonoscopy or flexible sigmoidoscopy) you may notice spotting of blood in your stool or on the toilet paper. If you underwent a bowel prep for your procedure, you may not have a normal bowel movement for a few days.  Please Note:  You might notice some irritation and congestion in your nose or some drainage.  This is from the oxygen used during your procedure.  There is no need for concern and it should clear up in a day or so.  SYMPTOMS TO REPORT IMMEDIATELY:  Following lower endoscopy (colonoscopy or flexible sigmoidoscopy):  Excessive amounts of blood in the stool  Significant tenderness or worsening of abdominal pains  Swelling of the abdomen that is new, acute  Fever of 100F or higher  For urgent or emergent issues, a gastroenterologist can be reached at any hour by calling [phone]. Do not use MyChart messaging for urgent concerns.    DIET:  We do recommend a small meal at first, but then you may proceed to your regular diet.  Drink plenty of fluids but you should avoid alcoholic beverages for 24 hours.  ACTIVITY:  You should plan to take  it easy for the rest of today and you should NOT DRIVE or use heavy machinery until tomorrow (because of the sedation medicines used during the test).    FOLLOW UP: Our staff will call the number listed on your records 48-72 hours following your procedure to check on you and address any questions or concerns that you may have regarding the information given to you following your procedure. If we do not reach you, we will leave a message.  We will attempt to reach you two times.  During this call, we will ask if you have developed any symptoms of COVID 19. If you develop any symptoms (ie: fever, flu-like symptoms, shortness of breath, cough etc.) before then, please call [phone].  If you test positive for Covid 19 in the 2 weeks post procedure, please call and report this information to [place].    If any biopsies were taken you will be contacted by phone or by letter within the next 1-3 weeks.  Please call us at [phone] if you have not heard about the biopsies in 3 weeks.    SIGNATURES/CONFIDENTIALITY: You and/or your care partner have signed paperwork which will be entered into your electronic medical record.  These signatures attest to the fact that that the information above on your After Visit Summary has been reviewed and is understood.  Full responsibility of the confidentiality of this discharge information lies with you and/or your care-partner.

## 2022-01-17 NOTE — Progress Notes (Signed)
Pt's states no medical or surgical changes since previsit or office visit.  ° °Vitals CW °

## 2022-01-17 NOTE — Progress Notes (Signed)
° °  Referring Provider: Fanny Bien, MD Primary Care Physician:  Fanny Bien, MD  Reason for Procedure:  Colon cancer screening   IMPRESSION:  Need for colon cancer screening Appropriate candidate for monitored anesthesia care  PLAN: Colonoscopy in the [place] today   HPI: Crystal Gutierrez is a 51 y.o. female presents for screening colonoscopy.  No prior colonoscopy or colon cancer screening.  No baseline GI symptoms.   No known family history of colon cancer or polyps. No family history of uterine/endometrial cancer, pancreatic cancer or gastric/stomach cancer.   Past Medical History:  Diagnosis Date   Biliary dyskinesia 10/23/2017   Gastric ulcer    Migraine    OCC   Osteopenia    very mild   Thyroid disease    pastial thyroidectomy    Past Surgical History:  Procedure Laterality Date   CHOLECYSTECTOMY N/A 10/23/2017   Procedure: LAPAROSCOPIC CHOLECYSTECTOMY WITH INTRAOPERATIVE CHOLANGIOGRAM;  Surgeon: Fanny Skates, MD;  Location: [facility];  Service: General;  Laterality: N/A;   PARTIAL THYMECTOMY     THYROIDECTOMY, PARTIAL Right 1996   cancel Thymectomy   VAGINAL DELIVERY  2005, 2008   /w epidural    WISDOM TOOTH EXTRACTION  2014   x3 extractions     Current Outpatient Medications  Medication Sig Dispense Refill   buPROPion (WELLBUTRIN XL) 150 MG 24 hr tablet Take 150 mg by mouth daily.     cholecalciferol (VITAMIN D3) 25 MCG (1000 UNIT) tablet Vitamin D     dexlansoprazole (DEXILANT) 60 MG capsule Take 1 capsule by mouth every other day.     famotidine (PEPCID) 40 MG tablet      ibuprofen (ADVIL) 200 MG tablet Take 200 mg by mouth every 6 (six) hours as needed. Prn pain/ headaches     Current Facility-Administered Medications  Medication Dose Route Frequency Provider Last Rate Last Admin   0.9 %  sodium chloride infusion  500 mL Intravenous Once Thornton Park, MD        Allergies as of 01/17/2022 - Review Complete 01/03/2022  Allergen  Reaction Noted   Vicodin [hydrocodone-acetaminophen] Nausea And Vomiting 10/20/2017    Family History  Problem Relation Age of Onset   Thyroid disease Mother    Cancer Mother    Cancer Other    Colon cancer Neg Hx    Colon polyps Neg Hx    Esophageal cancer Neg Hx    Rectal cancer Neg Hx    Stomach cancer Neg Hx      Physical Exam: General:   Alert,  well-nourished, pleasant and cooperative in NAD Head:  Normocephalic and atraumatic. Eyes:  Sclera clear, no icterus.   Conjunctiva pink. Mouth:  No deformity or lesions.   Neck:  Supple; no masses or thyromegaly. Lungs:  Clear throughout to auscultation.   No wheezes. Heart:  Regular rate and rhythm; no murmurs. Abdomen:  Soft, non-tender, nondistended, normal bowel sounds, no rebound or guarding.  Msk:  Symmetrical. No boney deformities LAD: No inguinal or umbilical LAD Extremities:  No clubbing or edema. Neurologic:  Alert and  oriented x4;  grossly nonfocal Skin:  No obvious rash or bruise. Psych:  Alert and cooperative. Normal mood and affect.     Studies/Results: No results found.    Clayden Withem L. Tarri Glenn, MD, MPH 01/17/2022, 1:16 PM

## 2022-01-17 NOTE — Progress Notes (Signed)
Report to PACU, RN, vss, BBS= Clear.  

## 2022-01-17 NOTE — Op Note (Signed)
[place] Patient Name: Crystal Gutierrez Procedure Date: 01/17/2022 2:05 PM MRN: [id number] Endoscopist: Thornton Park MD, MD Age: 51 Referring MD:  Date of Birth: 09/22/1971 Gender: Female Account #: [id number] Procedure:                Colonoscopy Indications:              Screening for colorectal malignant neoplasm, This                            is the patient's first colonoscopy                           No known family history of colon cancer or polyps Medicines:                Monitored Anesthesia Care Procedure:                Pre-Anesthesia Assessment:                           - Prior to the procedure, a History and Physical                            was performed, and patient medications and                            allergies were reviewed. The patient's tolerance of                            previous anesthesia was also reviewed. The risks                            and benefits of the procedure and the sedation                            options and risks were discussed with the patient.                            All questions were answered, and informed consent                            was obtained. Prior Anticoagulants: The patient has                            taken no previous anticoagulant or antiplatelet                            agents. ASA Grade Assessment: II - A patient with                            mild systemic disease. After reviewing the risks                            and benefits, the patient was deemed in  satisfactory condition to undergo the procedure.                           After obtaining informed consent, the colonoscope                            was passed under direct vision. Throughout the                            procedure, the patient's blood pressure, pulse, and                            oxygen saturations were monitored continuously. The                            Olympus CF-HQ190L  [id number]) Colonoscope was                            introduced through the anus and advanced to the 2                            cm into the ileum. A second forward view of the                            right colon was performed. The colonoscopy was                            performed without difficulty. The patient tolerated                            the procedure well. The quality of the bowel                            preparation was good. The terminal ileum, ileocecal                            valve, appendiceal orifice, and rectum were                            photographed. Scope In: 2:14:40 PM Scope Out: 2:31:40 PM Scope Withdrawal Time: 0 hours 13 minutes 35 seconds  Total Procedure Duration: 0 hours 17 minutes 0 seconds  Findings:                 The perianal and digital rectal examinations were                            normal.                           Non-bleeding internal hemorrhoids were found.                           Anal papilla(e) were hypertrophied.  A 3 mm polyp was found in the distal sigmoid colon,                            lcoated 28 cm from the anal verge. The polyp was                            sessile. The polyp was removed with a cold snare.                            Resection and retrieval were complete. Estimated                            blood loss was minimal.                           A 4 mm polyp was found in the hepatic flexure. The                            polyp was carpet-like. The polyp was removed with a                            cold snare. Resection and retrieval were complete.                            Estimated blood loss was minimal.                           The exam was otherwise without abnormality on                            direct and retroflexion views. Complications:            No immediate complications. Estimated blood loss:                            Minimal. Estimated Blood Loss:      Estimated blood loss was minimal. Impression:               - Non-bleeding internal hemorrhoids.                           - Anal papilla(e) were hypertrophied.                           - One 3 mm polyp in the distal sigmoid colon,                            removed with a cold snare. Resected and retrieved.                           - One 4 mm polyp at the hepatic flexure, removed                            with a cold snare. Resected and  retrieved.                           - The examination was otherwise normal on direct                            and retroflexion views. Recommendation:           - Patient has a contact number available for                            emergencies. The signs and symptoms of potential                            delayed complications were discussed with the                            patient. Return to normal activities tomorrow.                            Written discharge instructions were provided to the                            patient.                           - Resume previous diet.                           - Continue present medications.                           - Await pathology results.                           - Repeat colonoscopy date to be determined after                            pending pathology results are reviewed for                            surveillance.                           - Emerging evidence supports eating a diet of                            fruits, vegetables, grains, calcium, and yogurt                            while reducing red meat and alcohol may reduce the                            risk of colon cancer.                           - Thank you for allowing me to be involved  in your                            colon cancer prevention. Thornton Park MD, MD 01/17/2022 2:35:15 PM This report has been signed electronically.

## 2022-01-21 ENCOUNTER — Telehealth: Payer: Self-pay

## 2022-01-21 NOTE — Telephone Encounter (Signed)
Second attempt follow up call to pt, no answer.  

## 2022-01-21 NOTE — Telephone Encounter (Signed)
First post procedure follow up call, no answer 

## 2022-01-23 ENCOUNTER — Encounter: Payer: Self-pay | Admitting: Gastroenterology

## 2022-02-19 ENCOUNTER — Telehealth: Payer: Self-pay | Admitting: Gastroenterology

## 2022-02-19 NOTE — Telephone Encounter (Signed)
Chart Review Routing History Since 02/20/2021 [facility] Full Routing History)  Recipients Sent On Sent By Routed Reports   Fanny Bien, MD   02/19/2022  9:07 AM Aleatha Borer, LPN Letter on 04/01/5247 from Thornton Park, MD       Fanny Bien, MD   02/19/2022  9:06 AM Aleatha Borer, LPN Procedure visit on 01/17/2022 with Thornton Park, MD      Cover Page Message : Procedure report for your review       Pathology report printed and faxed to Dr. Ival Bible office with confirmation received.

## 2022-02-19 NOTE — Telephone Encounter (Signed)
Inbound call from Dr. Benjamine [facility] office requesting a call back stating they never received the report for this pt's colonoscopy that was done in January. You can contact ashley at [phone]. Please advise. Thank you.

## 2022-08-25 ENCOUNTER — Encounter: Payer: Self-pay | Admitting: Internal Medicine

## 2022-08-25 ENCOUNTER — Ambulatory Visit: Payer: 59 | Admitting: Internal Medicine

## 2022-08-25 VITALS — Temp 97.8°F | Resp 16 | Ht 61.0 in | Wt 140.0 lb

## 2022-08-25 DIAGNOSIS — Z8249 Family history of ischemic heart disease and other diseases of the circulatory system: Secondary | ICD-10-CM | POA: Insufficient documentation

## 2022-08-25 DIAGNOSIS — I479 Paroxysmal tachycardia, unspecified: Secondary | ICD-10-CM

## 2022-08-25 DIAGNOSIS — R002 Palpitations: Secondary | ICD-10-CM

## 2022-08-25 DIAGNOSIS — R0609 Other forms of dyspnea: Secondary | ICD-10-CM | POA: Insufficient documentation

## 2022-08-25 NOTE — Progress Notes (Signed)
Primary Physician/Referring:  Fanny Bien, MD  Patient ID: Crystal Gutierrez, female    DOB: 1971/01/02, 50 y.o.   MRN: [id number]  Chief Complaint  Patient presents with   New Patient (Initial Visit)    Palpatation    Palpitations   Referral   HPI:    Crystal Gutierrez  is a 51 y.o. female with past medical history significant for migraine, thyroid disease who is here to establish care with cardiology.  She has been having severe palpitations, tachycardia, and dyspnea on exertion.  She also admits that she has felt very lightheaded.  She does have a history of inappropriate sinus tachycardia in her family.  She denies any other cardiac history in herself.  She does not smoke or drink alcohol.  There is a history of pulmonary embolism in her mother, however patient has never had one herself.  She is worried because her symptoms are becoming more progressive and severe.  Patient has never had an echocardiogram or event monitor in the past.  Patient is agreeable to this.  We will further discuss adding any medication at the next visit.  Discussed what triggers to avoid for tachycardia, including staying hydrated and avoiding excess caffeine intake.  At rest she denies chest pain, shortness of breath, diaphoresis, syncope, PND, edema.  Past Medical History:  Diagnosis Date   Biliary dyskinesia 10/23/2017   Gastric ulcer    Migraine    OCC   Osteopenia    very mild   Thyroid disease    pastial thyroidectomy   Past Surgical History:  Procedure Laterality Date   CHOLECYSTECTOMY N/A 10/23/2017   Procedure: LAPAROSCOPIC CHOLECYSTECTOMY WITH INTRAOPERATIVE CHOLANGIOGRAM;  Surgeon: Fanny Skates, MD;  Location: [place];  Service: General;  Laterality: N/A;   COLONOSCOPY  01/17/2022   PARTIAL THYMECTOMY     THYROIDECTOMY, PARTIAL Right 1996   cancel Thymectomy   VAGINAL DELIVERY  2005, 2008   /w epidural    WISDOM TOOTH EXTRACTION  2014   x3 extractions    Family History  Problem  Relation Age of Onset   Thyroid disease Mother    Cancer Mother    Cancer Other    Colon cancer Neg Hx    Colon polyps Neg Hx    Esophageal cancer Neg Hx    Rectal cancer Neg Hx    Stomach cancer Neg Hx     Social History   Tobacco Use   Smoking status: Never   Smokeless tobacco: Never  Substance Use Topics   Alcohol use: Yes    Comment: occassionally   Marital Status: Married  ROS  Review of Systems  Cardiovascular:  Positive for chest pain, dyspnea on exertion, irregular heartbeat, near-syncope and palpitations.  All other systems reviewed and are negative. Objective  Temperature 97.8 F (36.6 C), temperature source Temporal, resp. rate 16, height '5\' 1"'$  (1.549 m), weight 140 lb (63.5 kg), SpO2 98 %. Body mass index is 26.45 kg/m.     08/25/2022   12:58 PM 01/17/2022    2:52 PM 01/17/2022    2:42 PM  Vitals with BMI  Height '5\' 1"'$     Weight 140 lbs    BMI 48.54    Systolic  627 91  Diastolic  55 65  Pulse  60 72     Physical Exam Vitals and nursing note reviewed.  Constitutional:      Appearance: Normal appearance. She is normal weight.  HENT:     Head: Normocephalic and atraumatic.  Neck:     Vascular: No carotid bruit.  Cardiovascular:     Rate and Rhythm: Normal rate and regular rhythm.     Pulses: Normal pulses.     Heart sounds: Normal heart sounds. No murmur heard.    No gallop.  Pulmonary:     Effort: Pulmonary effort is normal.     Breath sounds: Normal breath sounds.  Abdominal:     General: Abdomen is flat. Bowel sounds are normal.     Palpations: Abdomen is soft.  Musculoskeletal:     Right lower leg: No edema.     Left lower leg: No edema.  Skin:    General: Skin is warm and dry.  Neurological:     Mental Status: She is alert.   Medications and allergies   Allergies  Allergen Reactions   Vicodin [Hydrocodone-Acetaminophen] Nausea And Vomiting     Medication list after today's encounter   Current Outpatient Medications:     albuterol (VENTOLIN HFA) 108 (90 Base) MCG/ACT inhaler, SMARTSIG:2 Puff(s) By Mouth Every 4-6 Hours PRN, Disp: , Rfl:    buPROPion (WELLBUTRIN XL) 150 MG 24 hr tablet, Take 150 mg by mouth daily., Disp: , Rfl:    cholecalciferol (VITAMIN D3) 25 MCG (1000 UNIT) tablet, Vitamin D, Disp: , Rfl:    fexofenadine (ALLEGRA) 180 MG tablet, Take 180 mg by mouth daily., Disp: , Rfl:    ibuprofen (ADVIL) 200 MG tablet, Take 200 mg by mouth every 6 (six) hours as needed. Prn pain/ headaches, Disp: , Rfl:    montelukast (SINGULAIR) 10 MG tablet, Take 10 mg by mouth daily., Disp: , Rfl:    omeprazole (PRILOSEC) 40 MG capsule, Take 40 mg by mouth daily., Disp: , Rfl:   Laboratory examination:   Lab Results  Component Value Date   NA 139 10/20/2017   K 3.9 10/20/2017   CO2 26 10/20/2017   GLUCOSE 92 10/20/2017   BUN 10 10/20/2017   CREATININE 0.79 10/20/2017   CALCIUM 9.3 10/20/2017   GFRNONAA >60 10/20/2017       Latest Ref Rng & Units 10/20/2017    9:11 AM  CMP  Glucose 65 - 99 mg/dL 92   BUN 6 - 20 mg/dL 10   Creatinine 0.44 - 1.00 mg/dL 0.79   Sodium 135 - 145 mmol/L 139   Potassium 3.5 - 5.1 mmol/L 3.9   Chloride 101 - 111 mmol/L 107   CO2 22 - 32 mmol/L 26   Calcium 8.9 - 10.3 mg/dL 9.3   Total Protein 6.5 - 8.1 g/dL 7.3   Total Bilirubin 0.3 - 1.2 mg/dL 0.8   Alkaline Phos 38 - 126 U/L 67   AST 15 - 41 U/L 26   ALT 14 - 54 U/L 25       Latest Ref Rng & Units 10/20/2017    9:11 AM  CBC  WBC 4.0 - 10.5 K/uL 7.1   Hemoglobin 12.0 - 15.0 g/dL 14.2   Hematocrit 36.0 - 46.0 % 42.5   Platelets 150 - 400 K/uL 282     Lipid Panel No results for input(s): "CHOL", "TRIG", "LDLCALC", "VLDL", "HDL", "CHOLHDL", "LDLDIRECT" in the last 8760 hours.  HEMOGLOBIN A1C No results found for: "HGBA1C", "MPG" TSH No results for input(s): "TSH" in the last 8760 hours.  External labs:     Radiology:    Cardiac Studies:   No results found for this or any previous visit from the past  1095 days.  No results found for this or any previous visit from the past 1095 days.     EKG:   08/25/22 NSR, non-specific T wave abnl  Assessment     ICD-10-CM   1. Palpitations  R00.2 EKG 12-Lead    [facility] ECHOCARDIOGRAM COMPLETE    LONG TERM MONITOR (3-14 DAYS)    2. Family history of blood clots  Z82.49 PCV ECHOCARDIOGRAM COMPLETE    LONG TERM MONITOR (3-14 DAYS)    3. DOE (dyspnea on exertion)  R06.09 PCV ECHOCARDIOGRAM COMPLETE    LONG TERM MONITOR (3-14 DAYS)    4. Paroxysmal tachycardia, unspecified (HCC)  I47.9 PCV ECHOCARDIOGRAM COMPLETE    LONG TERM MONITOR (3-14 DAYS)       Orders Placed This Encounter  Procedures   LONG TERM MONITOR (3-14 DAYS)    Standing Status:   Future    Number of Occurrences:   1    Standing Expiration Date:   08/26/2023    Order Specific Question:   Where should this test be performed?    Answer:   [facility]    Order Specific Question:   Does the patient have an implanted cardiac device?    Answer:   No    Order Specific Question:   Prescribed days of wear    Answer:   14    Order Specific Question:   Type of enrollment    Answer:   Clinic Enrollment    Order Specific Question:   Release to patient    Answer:   Immediate   EKG 12-Lead   PCV ECHOCARDIOGRAM COMPLETE    Standing Status:   Future    Standing Expiration Date:   08/26/2023    No orders of the defined types were placed in this encounter.   Medications Discontinued During This Encounter  Medication Reason   dexlansoprazole (DEXILANT) 60 MG capsule    famotidine (PEPCID) 40 MG tablet      Recommendations:   Crystal Gutierrez is a 51 y.o. female with palpitations, tachycardia, and dyspnea on exertion  Avoid dehydration and excess heat Limit caffeine intake Schedule imaging tests in office - echo. Event monitor ordered to evaluate inappropriate sinus tachycardia.  Follow-up in 1-2 months or sooner if needed.     Crystal Flock, DO, [facility]  08/25/2022,  2:02 PM Office: [phone] Pager: [phone]

## 2022-09-05 ENCOUNTER — Inpatient Hospital Stay: Payer: 59

## 2022-09-05 ENCOUNTER — Ambulatory Visit: Payer: 59

## 2022-09-05 DIAGNOSIS — Z8249 Family history of ischemic heart disease and other diseases of the circulatory system: Secondary | ICD-10-CM

## 2022-09-05 DIAGNOSIS — I479 Paroxysmal tachycardia, unspecified: Secondary | ICD-10-CM

## 2022-09-05 DIAGNOSIS — R002 Palpitations: Secondary | ICD-10-CM

## 2022-09-05 DIAGNOSIS — R0609 Other forms of dyspnea: Secondary | ICD-10-CM

## 2022-10-08 ENCOUNTER — Ambulatory Visit: Payer: 59 | Admitting: Internal Medicine

## 2022-10-08 ENCOUNTER — Encounter: Payer: Self-pay | Admitting: Internal Medicine

## 2022-10-08 VITALS — BP 106/74 | HR 82 | Temp 97.8°F | Resp 16 | Ht 61.0 in | Wt 140.8 lb

## 2022-10-08 DIAGNOSIS — I479 Paroxysmal tachycardia, unspecified: Secondary | ICD-10-CM

## 2022-10-08 DIAGNOSIS — R002 Palpitations: Secondary | ICD-10-CM

## 2022-10-08 NOTE — Progress Notes (Signed)
Primary Physician/Referring:  Fanny Bien, MD  Patient ID: Crystal Gutierrez, female    DOB: 1971-04-30, 52 y.o.   MRN: [id number]  Chief Complaint  Patient presents with   Follow-up    6 Weeks   HPI:    Crystal Gutierrez  is a 51 y.o. female with past medical history significant for migraine, thyroid disease who is here for follow-up visit. Patient has been doing well. She feels much better knowing her event monitor and echo came back normal. Patient believes this was anxiety related. She denies chest pain, shortness of breath, diaphoresis, syncope, PND, edema.  Past Medical History:  Diagnosis Date   Biliary dyskinesia 10/23/2017   Gastric ulcer    Migraine    OCC   Osteopenia    very mild   Thyroid disease    pastial thyroidectomy   Past Surgical History:  Procedure Laterality Date   CHOLECYSTECTOMY N/A 10/23/2017   Procedure: LAPAROSCOPIC CHOLECYSTECTOMY WITH INTRAOPERATIVE CHOLANGIOGRAM;  Surgeon: Fanny Skates, MD;  Location: [place];  Service: General;  Laterality: N/A;   COLONOSCOPY  01/17/2022   PARTIAL THYMECTOMY     THYROIDECTOMY, PARTIAL Right 1996   cancel Thymectomy   VAGINAL DELIVERY  2005, 2008   /w epidural    WISDOM TOOTH EXTRACTION  2014   x3 extractions    Family History  Problem Relation Age of Onset   Thyroid disease Mother    Cancer Mother    Cancer Other    Colon cancer Neg Hx    Colon polyps Neg Hx    Esophageal cancer Neg Hx    Rectal cancer Neg Hx    Stomach cancer Neg Hx     Social History   Tobacco Use   Smoking status: Never   Smokeless tobacco: Never  Substance Use Topics   Alcohol use: Yes    Comment: occassionally   Marital Status: Married  ROS  Review of Systems  Cardiovascular:  Positive for palpitations.  All other systems reviewed and are negative.  Objective  Blood pressure 106/74, pulse 82, temperature 97.8 F (36.6 C), temperature source Temporal, resp. rate 16, height '5\' 1"'$  (1.549 m), weight 140 lb 12.8 oz  (63.9 kg), SpO2 99 %. Body mass index is 26.6 kg/m.     10/08/2022    1:13 PM 08/25/2022   12:58 PM 01/17/2022    2:52 PM  Vitals with BMI  Height '5\' 1"'$  '5\' 1"'$    Weight 140 lbs 13 oz 140 lbs   BMI 83.38 25.05   Systolic 397  673  Diastolic 74  55  Pulse 82  60     Physical Exam Vitals and nursing note reviewed.  Neck:     Vascular: No carotid bruit.  Cardiovascular:     Rate and Rhythm: Normal rate and regular rhythm.     Pulses: Normal pulses.     Heart sounds: Normal heart sounds. No murmur heard.    No gallop.  Pulmonary:     Effort: Pulmonary effort is normal.     Breath sounds: Normal breath sounds.  Abdominal:     General: Bowel sounds are normal.  Musculoskeletal:     Right lower leg: No edema.     Left lower leg: No edema.  Skin:    General: Skin is warm and dry.  Neurological:     Mental Status: She is alert.   Medications and allergies   Allergies  Allergen Reactions   Vicodin [Hydrocodone-Acetaminophen] Nausea And Vomiting  Medication list after today's encounter   Current Outpatient Medications:    albuterol (VENTOLIN HFA) 108 (90 Base) MCG/ACT inhaler, SMARTSIG:2 Puff(s) By Mouth Every 4-6 Hours PRN, Disp: , Rfl:    buPROPion (WELLBUTRIN XL) 150 MG 24 hr tablet, Take 150 mg by mouth daily., Disp: , Rfl:    cholecalciferol (VITAMIN D3) 25 MCG (1000 UNIT) tablet, Vitamin D, Disp: , Rfl:    fexofenadine (ALLEGRA) 180 MG tablet, Take 180 mg by mouth daily., Disp: , Rfl:    ibuprofen (ADVIL) 200 MG tablet, Take 200 mg by mouth every 6 (six) hours as needed. Prn pain/ headaches, Disp: , Rfl:    montelukast (SINGULAIR) 10 MG tablet, Take 10 mg by mouth daily., Disp: , Rfl:    omeprazole (PRILOSEC) 40 MG capsule, Take 40 mg by mouth daily., Disp: , Rfl:   Laboratory examination:   Lab Results  Component Value Date   NA 139 10/20/2017   K 3.9 10/20/2017   CO2 26 10/20/2017   GLUCOSE 92 10/20/2017   BUN 10 10/20/2017   CREATININE 0.79 10/20/2017    CALCIUM 9.3 10/20/2017   GFRNONAA >60 10/20/2017       Latest Ref Rng & Units 10/20/2017    9:11 AM  CMP  Glucose 65 - 99 mg/dL 92   BUN 6 - 20 mg/dL 10   Creatinine 0.44 - 1.00 mg/dL 0.79   Sodium 135 - 145 mmol/L 139   Potassium 3.5 - 5.1 mmol/L 3.9   Chloride 101 - 111 mmol/L 107   CO2 22 - 32 mmol/L 26   Calcium 8.9 - 10.3 mg/dL 9.3   Total Protein 6.5 - 8.1 g/dL 7.3   Total Bilirubin 0.3 - 1.2 mg/dL 0.8   Alkaline Phos 38 - 126 U/L 67   AST 15 - 41 U/L 26   ALT 14 - 54 U/L 25       Latest Ref Rng & Units 10/20/2017    9:11 AM  CBC  WBC 4.0 - 10.5 K/uL 7.1   Hemoglobin 12.0 - 15.0 g/dL 14.2   Hematocrit 36.0 - 46.0 % 42.5   Platelets 150 - 400 K/uL 282     Lipid Panel No results for input(s): "CHOL", "TRIG", "LDLCALC", "VLDL", "HDL", "CHOLHDL", "LDLDIRECT" in the last 8760 hours.  HEMOGLOBIN A1C No results found for: "HGBA1C", "MPG" TSH No results for input(s): "TSH" in the last 8760 hours.  External labs:     Radiology:    Cardiac Studies:   Echocardiogram 09/05/2022:  Normal LV systolic function with visual EF 55-60%. Left ventricle cavity  is normal in size. Normal global wall motion. Normal diastolic filling  pattern, normal LAP. Calculated EF 55%.  Structurally normal tricuspid valve with no regurgitation. No evidence of  pulmonary hypertension.  No prior available for comparison.   Patch Wear Time:  13 days and 22 hours (2023-09-08T09:10:56-0400 to 2023-09-22T07:25:48-0400)   Patient had a min HR of 56 bpm, max HR of 153 bpm, and avg HR of 88 bpm. Predominant underlying rhythm was Sinus Rhythm. 3 Supraventricular Tachycardia runs occurred, the run with the fastest interval lasting 5 beats with a max rate of 152 bpm (avg 121 bpm); the run with the fastest interval was also the longest. Isolated SVEs were rare (<1.0%), SVE Couplets were rare (<1.0%), and SVE Triplets were rare (<1.0%). Isolated VEs were rare (<1.0%), and no VE Couplets or VE  Triplets were present.      EKG:   08/25/22 NSR, non-specific T  wave abnl  Assessment     ICD-10-CM   1. Palpitations  R00.2     2. Paroxysmal tachycardia, unspecified (HCC)  I47.9        No orders of the defined types were placed in this encounter.   No orders of the defined types were placed in this encounter.   There are no discontinued medications.    Recommendations:   Crystal Gutierrez is a 51 y.o. female with palpitations, tachycardia  Avoid dehydration and excess heat Limit caffeine intake Echo and event monitor discussed with patient - no acute abnormality Follow-up PRN     Floydene Flock, DO, [facility]  10/08/2022, 1:30 PM Office: [phone] Pager: [phone]

## 2024-03-17 ENCOUNTER — Other Ambulatory Visit: Payer: Self-pay | Admitting: Family Medicine

## 2024-03-17 DIAGNOSIS — E2839 Other primary ovarian failure: Secondary | ICD-10-CM

## 2024-09-06 ENCOUNTER — Ambulatory Visit (HOSPITAL_BASED_OUTPATIENT_CLINIC_OR_DEPARTMENT_OTHER)
Admission: RE | Admit: 2024-09-06 | Discharge: 2024-09-06 | Disposition: A | Discharge status: Home / Self Care | Source: Ambulatory Visit | Attending: Family Medicine | Admitting: Family Medicine

## 2024-09-06 DIAGNOSIS — E2839 Other primary ovarian failure: Secondary | ICD-10-CM | POA: Insufficient documentation

## 2024-09-15 ENCOUNTER — Ambulatory Visit (HOSPITAL_BASED_OUTPATIENT_CLINIC_OR_DEPARTMENT_OTHER)

## 2024-09-15 ENCOUNTER — Ambulatory Visit (HOSPITAL_BASED_OUTPATIENT_CLINIC_OR_DEPARTMENT_OTHER): Admitting: Student

## 2024-09-15 DIAGNOSIS — G8929 Other chronic pain: Secondary | ICD-10-CM

## 2024-09-15 DIAGNOSIS — M25562 Pain in left knee: Secondary | ICD-10-CM

## 2024-09-15 NOTE — Progress Notes (Signed)
 Chief Complaint: Right knee pain    Discussed the use of AI scribe software for clinical note transcription with the patient, who gave verbal consent to proceed.  History of Present Illness Crystal Gutierrez is a pleasant 53 year old female who presents with acute right knee pain.  The right knee pain began yesterday while carrying items, initially as a twinge without buckling. This morning, the pain intensified, initially preventing ambulation, but improved slightly throughout the day. The pain is localized to the medial aspect of the knee and worsens with weight-bearing activities. Bending the knee is particularly painful, though there is no swelling, popping, clicking, locking, or catching. She can straighten the knee without difficulty, but bending exacerbates the pain. 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 She took ibuprofen earlier in the day for pain management. She has no previous knee issues. She has been diagnosed with osteopenia based on a recent DEXA scan.   Surgical History:   None  PMH/PSH/Family History/Social History/Meds/Allergies:    Past Medical History:  Diagnosis Date   Biliary dyskinesia 10/23/2017   Gastric ulcer    Migraine    OCC   Osteopenia    very mild   Thyroid disease    pastial thyroidectomy   Past Surgical History:  Procedure Laterality Date   CHOLECYSTECTOMY N/A 10/23/2017   Procedure: LAPAROSCOPIC CHOLECYSTECTOMY WITH INTRAOPERATIVE CHOLANGIOGRAM;  Surgeon: Gail Favorite, MD;  Location: [facility];  Service: General;  Laterality: N/A;   COLONOSCOPY  01/17/2022   PARTIAL THYMECTOMY     THYROIDECTOMY, PARTIAL Right 1996   cancel Thymectomy   VAGINAL DELIVERY  2005, 2008   /w epidural    WISDOM TOOTH EXTRACTION  2014   x3 extractions    Social History   Socioeconomic History   Marital status: Married    Spouse name: Not on file    Number of children: Not on file   Years of education: Not on file   Highest education level: Not on file  Occupational History   Not on file  Tobacco Use   Smoking status: Never   Smokeless tobacco: Never  Vaping Use   Vaping status: Never Used  Substance and Sexual Activity   Alcohol use: Yes    Comment: occassionally   Drug use: No   Sexual activity: Yes  Other Topics Concern   Not on file  Social History Narrative   Not on file   Social Drivers of Health   Financial Resource Strain: Not on file  Food Insecurity: Not on file  Transportation Needs: Not on file  Physical Activity: Not on file  Stress: Not on file  Social Connections: Not on file   Family History  Problem Relation Age of Onset   Thyroid disease Mother    Cancer Mother    Cancer Other    Colon cancer Neg Hx    Colon polyps Neg Hx    Esophageal cancer Neg Hx    Rectal cancer Neg Hx    Stomach cancer Neg Hx    Allergies  Allergen Reactions   Vicodin [Hydrocodone -Acetaminophen ] Nausea And Vomiting   Current Outpatient Medications  Medication Sig Dispense Refill   albuterol (VENTOLIN HFA) 108 (90 Base) MCG/ACT inhaler SMARTSIG:2 Puff(s) By Mouth Every 4-6 Hours PRN  buPROPion (WELLBUTRIN XL) 150 MG 24 hr tablet Take 150 mg by mouth daily.     cholecalciferol (VITAMIN D3) 25 MCG (1000 UNIT) tablet Vitamin D     fexofenadine (ALLEGRA) 180 MG tablet Take 180 mg by mouth daily.     ibuprofen (ADVIL) 200 MG tablet Take 200 mg by mouth every 6 (six) hours as needed. Prn pain/ headaches     montelukast (SINGULAIR) 10 MG tablet Take 10 mg by mouth daily.     omeprazole (PRILOSEC) 40 MG capsule Take 40 mg by mouth daily.     No current facility-administered medications for this visit.   No results found.  Review of Systems:   A ROS was performed including pertinent positives and negatives as documented in the HPI.  Physical Exam :   Constitutional: NAD and appears stated age Neurological: Alert and  oriented Psych: Appropriate affect and cooperative There were no vitals taken for this visit.   Comprehensive Musculoskeletal Exam:    Exam of the left knee demonstrates active range of motion from -3 to 130 degrees.  Tenderness over the medial joint line.  Minimal effusion is present without overlying erythema or warmth.  Slight discomfort is noted with valgus stress of the knee although no laxity is appreciated.  Negative McMurray.  Imaging:   Xray (left knee 4 views): No acute bony abnormality.  Joint spaces are well-maintained.   I personally reviewed and interpreted the radiographs.      Assessment & Plan Left knee pain She experiences acute medial knee pain with weight-bearing and bending. X-rays are normal. The differential diagnosis includes MCL sprain or meniscal injury. Conservative management is appropriate. Advise ibuprofen for anti-inflammatory effect. Recommend a compression sleeve or supportive knee brace if instability occurs. Instruct to avoid activities that exacerbate pain and seek assistance with heavy lifting. Advise follow-up if symptoms persist or worsen, especially if mechanical symptoms develop.       I personally saw and evaluated the patient, and participated in the management and treatment plan.  Leonce Reveal, PA-C Orthopedics

## 2024-10-03 ENCOUNTER — Encounter (INDEPENDENT_AMBULATORY_CARE_PROVIDER_SITE_OTHER): Payer: Self-pay

## 2024-10-31 ENCOUNTER — Encounter: Payer: Self-pay | Admitting: Radiology

## 2024-11-17 ENCOUNTER — Ambulatory Visit (INDEPENDENT_AMBULATORY_CARE_PROVIDER_SITE_OTHER): Admitting: Otolaryngology

## 2024-11-17 ENCOUNTER — Encounter (INDEPENDENT_AMBULATORY_CARE_PROVIDER_SITE_OTHER): Payer: Self-pay | Admitting: Otolaryngology

## 2024-11-17 VITALS — BP 100/66 | HR 84 | Temp 98.0°F | Ht 60.0 in | Wt 150.0 lb

## 2024-11-17 DIAGNOSIS — H9313 Tinnitus, bilateral: Secondary | ICD-10-CM | POA: Diagnosis not present

## 2024-11-17 DIAGNOSIS — H9193 Unspecified hearing loss, bilateral: Secondary | ICD-10-CM | POA: Diagnosis not present

## 2024-11-17 NOTE — Progress Notes (Signed)
 CC: Bilateral tinnitus  Discussed the use of AI scribe software for clinical note transcription with the patient, who gave verbal consent to proceed.  History of Present Illness Crystal Gutierrez is a 53 year old female who presents with bilateral tinnitus  Approximately one month ago, she began experiencing persistent bilateral tinnitus, described as high-pitched ringing sometimes accompanied by a clicking sound. The tinnitus is more noticeable in quiet environments, such as when trying to sleep.  She has noticed slight difficulty in hearing others, although she has no history of ear infections, surgeries, or use of ear tubes. There is a family history of ear problems, as her father has issues with earwax and hearing loss.  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 No current ear pain.    Past Medical History:  Diagnosis Date   Biliary dyskinesia 10/23/2017   Gastric ulcer    Migraine    OCC   Osteopenia    very mild   Thyroid disease    pastial thyroidectomy    Past Surgical History:  Procedure Laterality Date   CHOLECYSTECTOMY N/A 10/23/2017   Procedure: LAPAROSCOPIC CHOLECYSTECTOMY WITH INTRAOPERATIVE CHOLANGIOGRAM;  Surgeon: Gail Favorite, MD;  Location: [facility] OR;  Service: General;  Laterality: N/A;   COLONOSCOPY  01/17/2022   PARTIAL THYMECTOMY     THYROIDECTOMY, PARTIAL Right 1996   cancel Thymectomy   VAGINAL DELIVERY  2005, 2008   /w epidural    WISDOM TOOTH EXTRACTION  2014   x3 extractions     Family History  Problem Relation Age of Onset   Thyroid disease Mother    Cancer Mother    Cancer Other    Colon cancer Neg Hx    Colon polyps Neg Hx    Esophageal cancer Neg Hx    Rectal cancer Neg Hx    Stomach cancer Neg Hx     Social History:  reports that she has never smoked. She has never used smokeless tobacco. She reports current alcohol use. She reports that she does  not use drugs.  Allergies:  Allergies  Allergen Reactions   Vicodin [Hydrocodone -Acetaminophen ] Nausea And Vomiting    Prior to Admission medications   Medication Sig Start Date End Date Taking? Authorizing Provider  albuterol (VENTOLIN HFA) 108 (90 Base) MCG/ACT inhaler SMARTSIG:2 Puff(s) By Mouth Every 4-6 Hours PRN 05/14/22  Yes [provider]  buPROPion (WELLBUTRIN XL) 150 MG 24 hr tablet Take 150 mg by mouth daily. 12/09/21  Yes [provider]  cholecalciferol (VITAMIN D3) 25 MCG (1000 UNIT) tablet Vitamin D   Yes [provider]  fexofenadine (ALLEGRA) 180 MG tablet Take 180 mg by mouth daily.   Yes [provider]  ibuprofen (ADVIL) 200 MG tablet Take 200 mg by mouth every 6 (six) hours as needed. Prn pain/ headaches   Yes [provider]  montelukast (SINGULAIR) 10 MG tablet Take 10 mg by mouth daily. 08/01/22  Yes [provider]  omeprazole (PRILOSEC) 40 MG capsule Take 40 mg by mouth daily. 08/12/22  Yes [provider]    Blood pressure 100/66, pulse 84, temperature 98 F (36.7 C), temperature source Oral, height 5' (1.524 m), weight 150 lb (68 kg), SpO2 96%. Exam: General: Communicates without difficulty, well nourished, no acute distress. Head: Normocephalic, no evidence injury, no tenderness, facial buttresses intact without stepoff. Face/sinus: No tenderness to palpation and percussion. Facial movement is normal  and symmetric. Eyes: PERRL, EOMI. No scleral icterus, conjunctivae clear. Neuro: CN II exam reveals vision grossly intact.  No nystagmus at any point of gaze. Ears: Auricles well formed without lesions.  Ear canals are intact without mass or lesion.  No erythema or edema is appreciated.  The TMs are intact without fluid. Nose: External evaluation reveals normal support and skin without lesions.  Dorsum is intact.  Anterior rhinoscopy reveals congested mucosa over anterior aspect of inferior turbinates and  intact septum.  No purulence noted. Oral:  Oral cavity and oropharynx are intact, symmetric, without erythema or edema.  Mucosa is moist without lesions. Neck: Full range of motion without pain.  There is no significant lymphadenopathy.  No masses palpable.  Thyroid bed within normal limits to palpation.  Parotid glands and submandibular glands equal bilaterally without mass.  Trachea is midline. Neuro:  CN 2-12 grossly intact.   Assessment and Plan Assessment & Plan Bilateral tinnitus and hearing loss Bilateral tinnitus and hearing loss for approximately one month, with high-pitched ringing and occasional clicking. No significant ear pain. Steroid treatment provided temporary relief, suggesting possible inflammation or viral involvement. No significant anatomical abnormalities on examination.  - The strategies to cope with tinnitus, including the use of masker, hearing aids, tinnitus retraining therapy, and avoidance of caffeine and alcohol are discussed. - Scheduled outpatient hearing test to assess hearing loss and tinnitus. - Discussed potential use of hearing aids if hearing loss is significant and bothersome.   Naelle Diegel W Sally Reimers 11/17/2024, 2:37 PM

## 2024-11-21 ENCOUNTER — Other Ambulatory Visit: Payer: Self-pay | Admitting: Obstetrics and Gynecology

## 2024-11-21 DIAGNOSIS — R928 Other abnormal and inconclusive findings on diagnostic imaging of breast: Secondary | ICD-10-CM

## 2024-11-29 ENCOUNTER — Ambulatory Visit
Admission: RE | Admit: 2024-11-29 | Discharge: 2024-11-29 | Disposition: A | Source: Ambulatory Visit | Attending: Obstetrics and Gynecology | Admitting: Obstetrics and Gynecology

## 2024-11-29 DIAGNOSIS — R928 Other abnormal and inconclusive findings on diagnostic imaging of breast: Secondary | ICD-10-CM

## 2024-12-06 ENCOUNTER — Other Ambulatory Visit

## 2025-01-09 ENCOUNTER — Ambulatory Visit (INDEPENDENT_AMBULATORY_CARE_PROVIDER_SITE_OTHER): Admitting: Audiology

## 2025-02-03 ENCOUNTER — Ambulatory Visit (INDEPENDENT_AMBULATORY_CARE_PROVIDER_SITE_OTHER): Admitting: Audiology

## 2025-02-03 DIAGNOSIS — H903 Sensorineural hearing loss, bilateral: Secondary | ICD-10-CM

## 2025-02-03 NOTE — Progress Notes (Signed)
" °  [address] [place], [place] [address] [phone]  Audiological Evaluation    Name: Crystal Gutierrez     DOB:   1971/10/06      MRN:   [id number]                                                                                     Service Date: 02/03/2025     Accompanied by: self     Patient comes today after Dr. Karis, ENT sent a referral for a hearing evaluation due to concerns with hearing loss.   Symptoms Yes Details  Hearing loss  []    Tinnitus  [x]  Onset October 2025  Ear pain/ infections/pressure  []    Balance problems  [x]  Had to miss work because of dizziness , float like feeling - it happened in the Fall two or three times   Noise exposure history  []    Previous ear surgeries  []    Family history of hearing loss  [x]  Father, reportedly de to noise exposure  Amplification  []    Other  []      Otoscopy: Right ear: clear external ear canal and notable landmarks visualized on the tympanic membrane. Left ear:  clear external ear canal and notable landmarks visualized on the tympanic membrane.  Tympanometry: Right ear: Type A - Normal external ear canal volume with normal middle ear pressure and normal tympanic membrane compliance. Findings are consistent with normal middle ear function. Left ear: Type A - Normal external ear canal volume with normal middle ear pressure and normal tympanic membrane compliance. Findings are consistent with normal middle ear function.   Hearing Evaluation The hearing test results were completed under headphones and re-checked with inserts and results are deemed to be of good reliability. Test technique:  conventional    Pure tone Audiometry: Right ear- .Normal hearing from 250 Hz -4000 Hz then mild sloping moderately-severe to presumably sensorineural hearing loss   6000 Hz - 8000 Hz Left ear-  .Normal hearing from 250 Hz - 4000 Hz and then mild sloping moderate to presumably sensorineural hearing loss  6000 Hz - 8000  Hz  Speech Audiometry: Right ear- Speech Reception Threshold (SRT) was obtained at 15 dBHL. Left ear-Speech Reception Threshold (SRT) was obtained at 15 dBHL.   Word Recognition Score Tested using NU-6 (recorded) Right ear: 100% was obtained at a presentation level of 65 dBHL with contralateral masking which is deemed as  excellent. Left ear: 100% was obtained at a presentation level of 65 dBHL with contralateral masking which is deemed as  excellent.   Impression: There is not a significant difference in pure-tone thresholds between ears., There is not a significant difference in the word recognition score in between ears.    Recommendations: Follow up with ENT as per MD. Return for a hearing evaluation in two years, before if concerns with tinnitus hearing changes arise or per MD recommendation. Consider various tinnitus strategies, including the use of a sound generator, hearing aids, and/or tinnitus retraining therapy. Patient may consider going to [facility] tinnitus clinic.   Fleet Higham MARIE LEROUX-MARTINEZ, AUD  "
